# Patient Record
Sex: Male | Born: 1950 | Race: Black or African American | Hispanic: No | Marital: Married | Smoking: Former smoker
Health system: Southern US, Academic
[De-identification: ages and names within clinical notes are randomized; demographics above are authoritative.]

## PROBLEM LIST (undated history)

## (undated) DIAGNOSIS — E119 Type 2 diabetes mellitus without complications: Secondary | ICD-10-CM

## (undated) DIAGNOSIS — I509 Heart failure, unspecified: Secondary | ICD-10-CM

## (undated) DIAGNOSIS — I10 Essential (primary) hypertension: Secondary | ICD-10-CM

## (undated) HISTORY — DX: Type 2 diabetes mellitus without complications: E11.9

## (undated) HISTORY — DX: Heart failure, unspecified: I50.9

## (undated) HISTORY — PX: HX ELBOW SURGERY: 2100001297

## (undated) HISTORY — PX: HX HAND SURGERY: 2100001299

## (undated) HISTORY — PX: HX BACK SURGERY: SHX140

## (undated) HISTORY — PX: HX COLONOSCOPY: 2100001147

## (undated) HISTORY — DX: Essential (primary) hypertension: I10

---

## 1996-12-07 ENCOUNTER — Other Ambulatory Visit (HOSPITAL_COMMUNITY): Payer: Self-pay | Admitting: Internal Medicine

## 2019-04-27 IMAGING — US US RETROPERITONEAL COMPLETE
1 series · 14 of 25 positions shown · non-contrast
Comparison: None available.

EXAM:  US RETROPERITONEAL COMPLETE
INDICATION: Uncontrolled hypertension.

[Series 4: us retroperitoneal complete · 0.27mm/px · 14 of 64 slices shown]
[im 1/64]
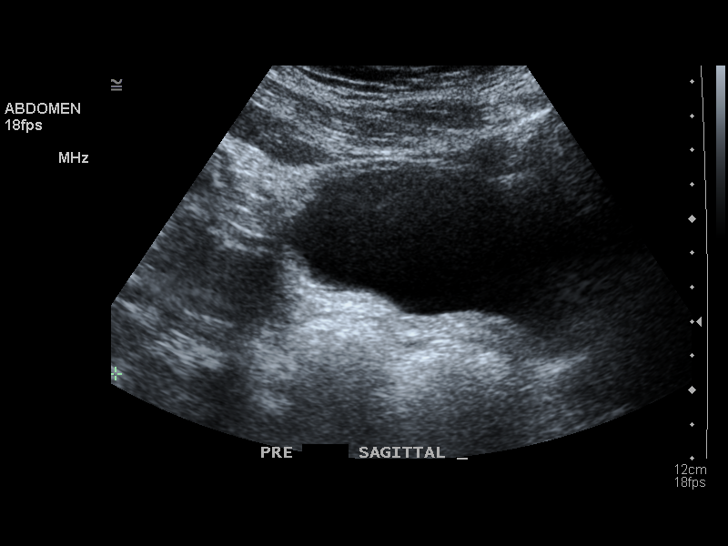
[im 6/64]
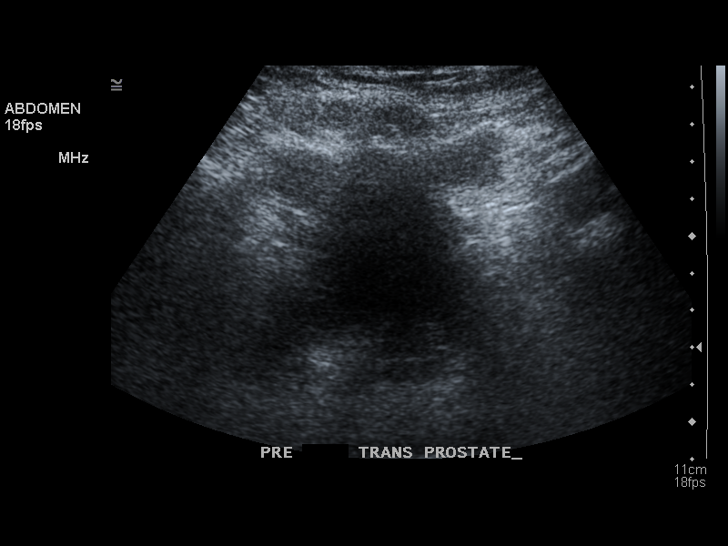
[im 11/64]
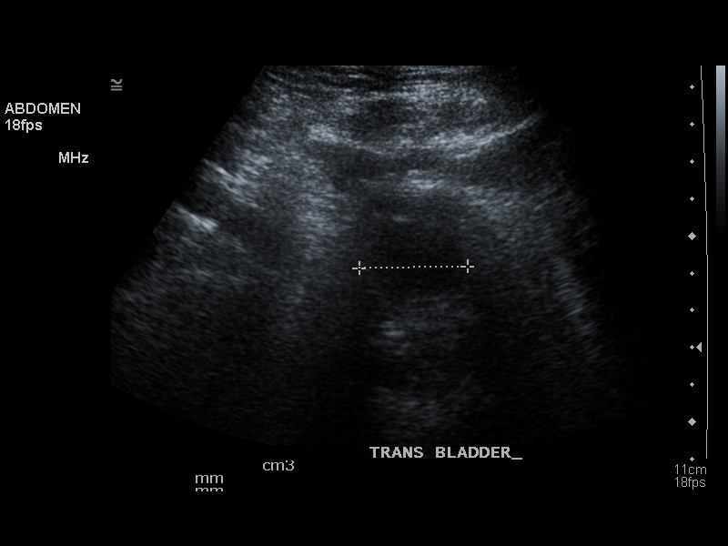
[im 16/64]
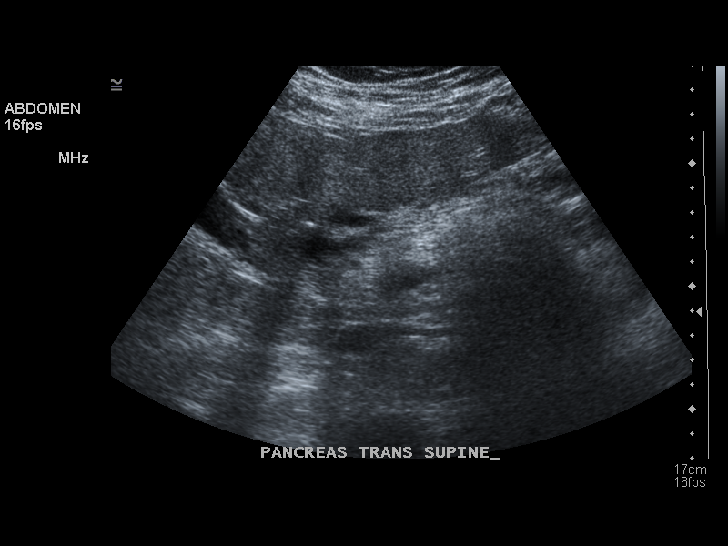
[im 22/64]
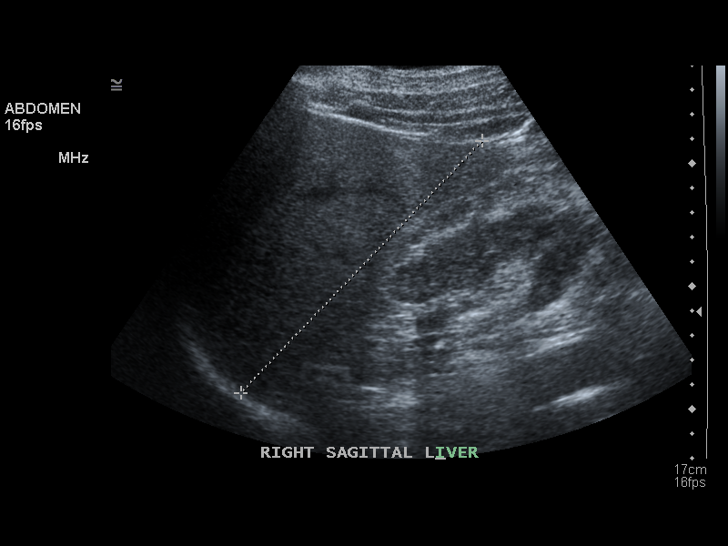
[im 24/64]
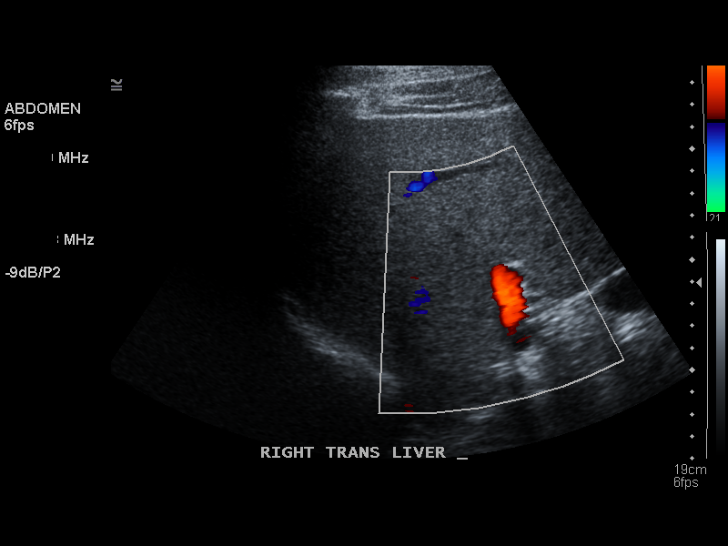
[im 29/64]
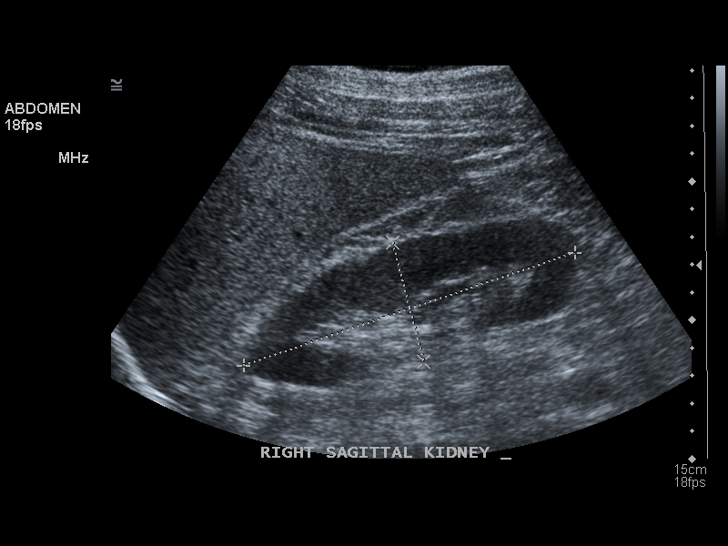
[im 35/64]
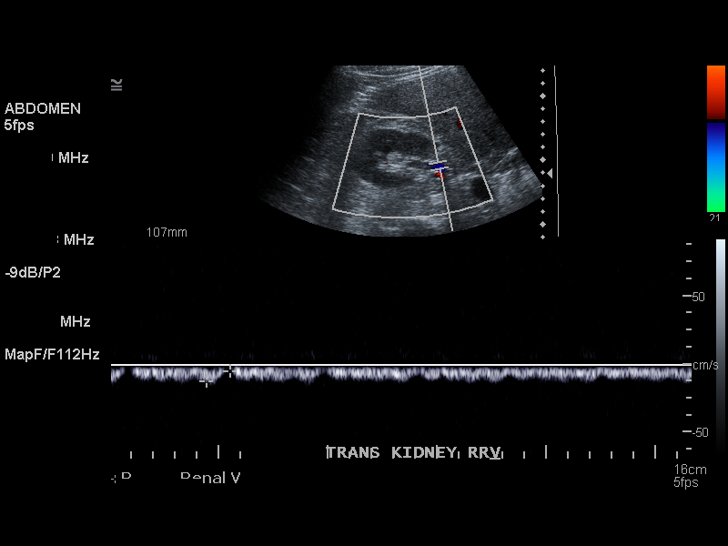
[im 40/64]
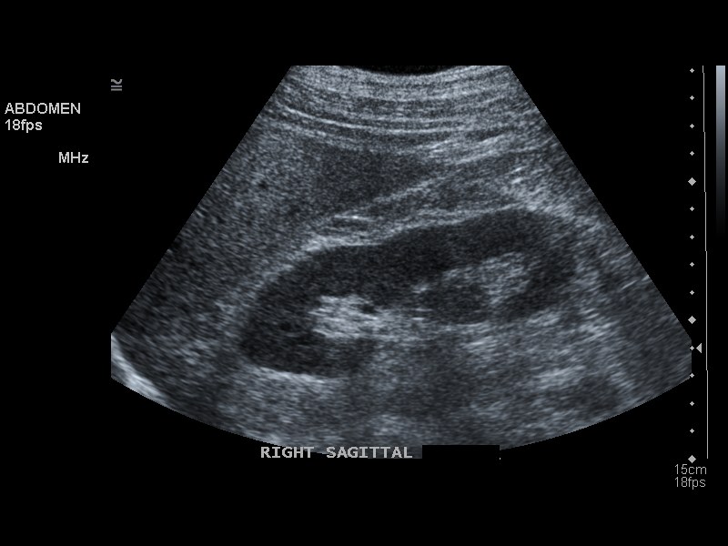
[im 43/64]
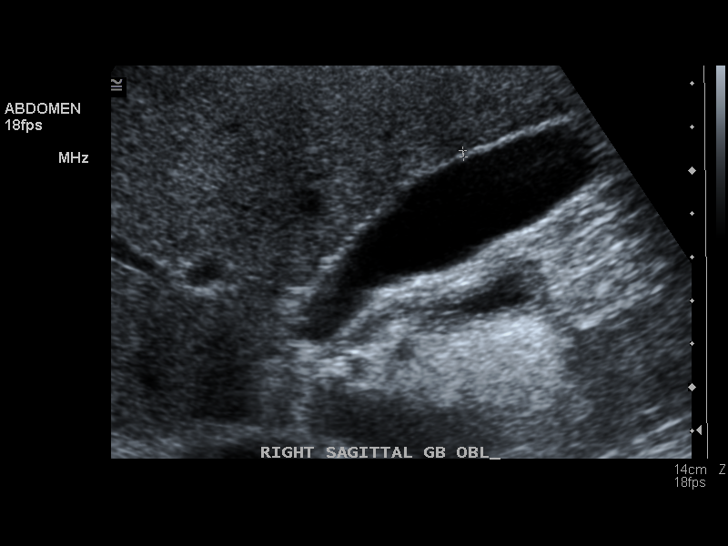
[im 48/64]
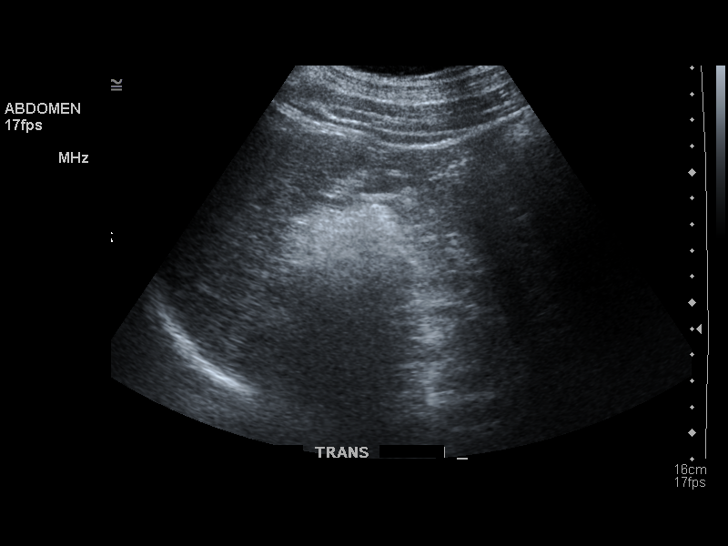
[im 53/64]
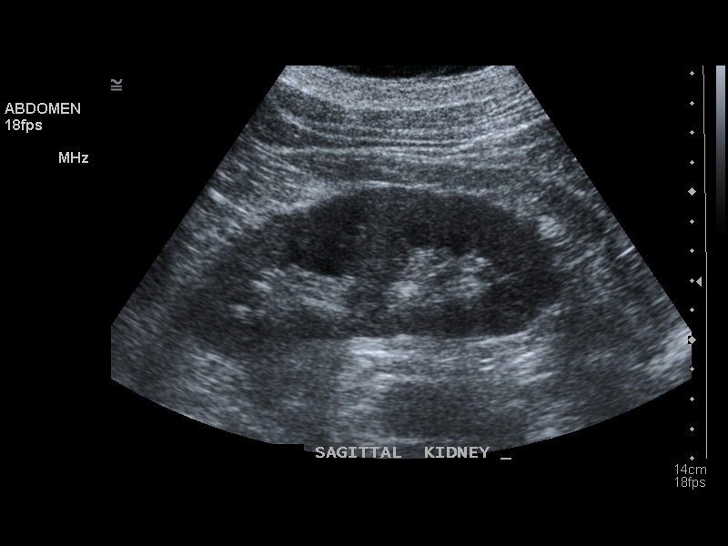
[im 58/64]
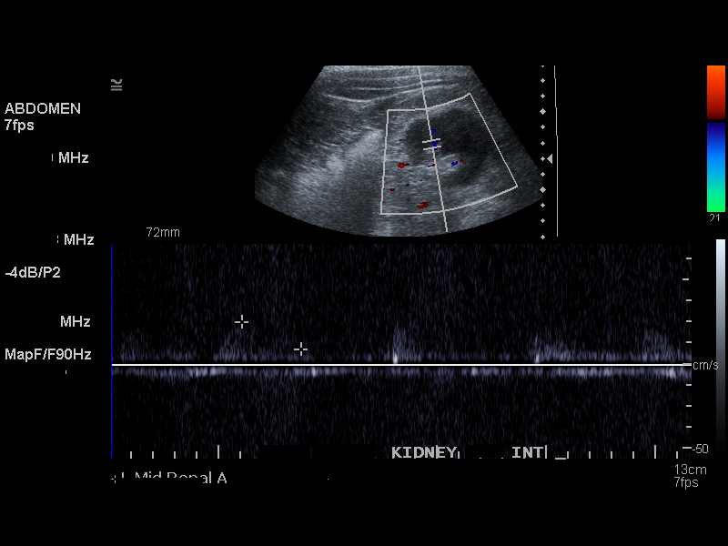
[im 64/64]
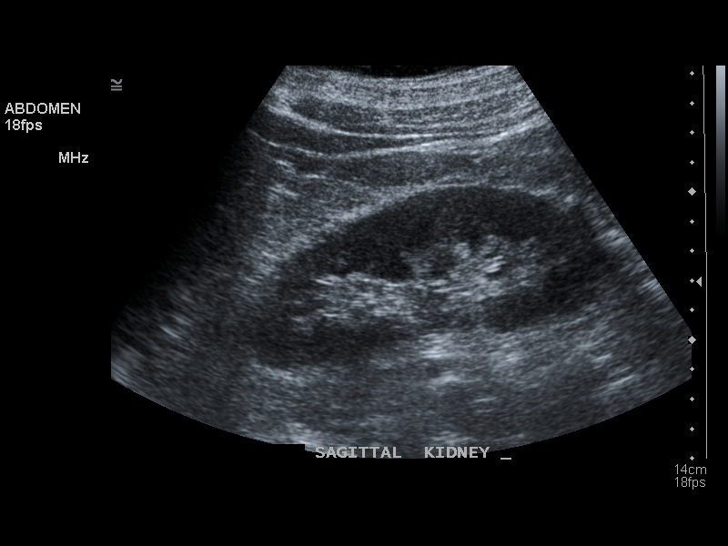

[14 of 25 positions shown; findings below may reference images not displayed]

FINDINGS: Kidneys are normal in echogenicity and measure 12.5 cm bilaterally. There is no hydronephrosis, mass, cyst or shadowing calculus on either side. There is also no evidence of renal artery stenosis.

Urinary bladder is partially distended and grossly unremarkable. There is no significant post void residual.
IMPRESSION: Unremarkable exam.

## 2019-11-11 DIAGNOSIS — E119 Type 2 diabetes mellitus without complications: Secondary | ICD-10-CM | POA: Insufficient documentation

## 2019-11-11 DIAGNOSIS — I509 Heart failure, unspecified: Secondary | ICD-10-CM | POA: Insufficient documentation

## 2021-10-03 ENCOUNTER — Encounter (INDEPENDENT_AMBULATORY_CARE_PROVIDER_SITE_OTHER): Payer: Self-pay

## 2021-12-12 ENCOUNTER — Encounter (INDEPENDENT_AMBULATORY_CARE_PROVIDER_SITE_OTHER): Payer: Self-pay | Admitting: Nephrology

## 2021-12-12 DIAGNOSIS — I10 Essential (primary) hypertension: Secondary | ICD-10-CM | POA: Insufficient documentation

## 2021-12-12 DIAGNOSIS — E559 Vitamin D deficiency, unspecified: Secondary | ICD-10-CM

## 2021-12-12 DIAGNOSIS — N189 Chronic kidney disease, unspecified: Secondary | ICD-10-CM

## 2021-12-12 DIAGNOSIS — Z131 Encounter for screening for diabetes mellitus: Secondary | ICD-10-CM

## 2021-12-12 DIAGNOSIS — N1832 Chronic kidney disease, stage 3b (CMS HCC): Secondary | ICD-10-CM | POA: Insufficient documentation

## 2021-12-12 DIAGNOSIS — E538 Deficiency of other specified B group vitamins: Secondary | ICD-10-CM

## 2021-12-12 DIAGNOSIS — D631 Anemia in chronic kidney disease: Secondary | ICD-10-CM | POA: Insufficient documentation

## 2021-12-12 NOTE — Progress Notes (Signed)
71 year old gentleman with past medical history of diabetes and hypertension on multiple blood pressure medications presenting to the clinic for follow-up.  Patient denies nausea or vomiting.  No chest pain or shortness of breath.  Patient taking currently the following medication Benicar Coreg Lasix hydralazine, he is not taking Aldactone or amlodipine.  Patient takes also clonidine 0.1 mg at bedtime.  Patient denies nausea or vomiting no chest pain or shortness of breath denies diarrhea.  He reports improvement in the edema.  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 function is better creatinine 1.7 GFR 52 mils per minute anemia of chronic kidney disease hemoglobin 11.9 hemoglobin A1c 5.9 kidney ultrasound showed small bilateral cysts benign anemia profile showed very low iron needs iron supplements twice a day drink plenty of fluids keep sugar controlled keep blood pressure control repeat lab work see him back in 6 months    Labs::       Creatinine 1.7 mg/dL (0.3-1.4)  H 06/06/21 14:40 06/06/21       Glomerular Filtration Rate Calc 52 mL/min (>59)  L 06/06/21 14:40 06/06/21       BUN/Creatinine Ratio 11  (10-20) 06/06/21 14:40 06/06/21       Potassium Level 4.6 mEq/L (3.6-5.6) 06/06/21 14:40 06/06/21       Parathyroid Hormone (Intact) 54.8 pg/mL (12-88) 06/06/21 14:10 06/06/21       Urine Microalbumin 8.0 mg/L (0-19) 06/06/21 14:40 06/06/21       Hemoglobin 11.9 g/dL (13.5-18.0)  L 06/06/21 14:40 06/06/21       Red Blood Count 4.20 M/uL (4.70-6.00)  L 06/06/21 14:40 06/06/21       Hemoglobin A1c Percent 5.9 % (4.3-6.1) 06/06/21 14:10 06/06/21       Renal Ultrasound   04/20/21 18:00 04/20/21

## 2021-12-18 ENCOUNTER — Encounter (INDEPENDENT_AMBULATORY_CARE_PROVIDER_SITE_OTHER): Payer: Self-pay | Admitting: Nephrology

## 2021-12-18 ENCOUNTER — Other Ambulatory Visit (INDEPENDENT_AMBULATORY_CARE_PROVIDER_SITE_OTHER): Payer: Self-pay | Admitting: Student in an Organized Health Care Education/Training Program

## 2021-12-18 MED ORDER — SILDENAFIL 100 MG TABLET
100.0000 mg | ORAL_TABLET | ORAL | 1 refills | Status: AC | PRN
Start: 2021-12-18 — End: 2022-03-18

## 2021-12-20 ENCOUNTER — Encounter (INDEPENDENT_AMBULATORY_CARE_PROVIDER_SITE_OTHER): Payer: Self-pay | Admitting: Nephrology

## 2021-12-25 ENCOUNTER — Ambulatory Visit (HOSPITAL_COMMUNITY): Payer: Self-pay | Admitting: HEMATOLOGY-ONCOLOGY

## 2021-12-26 ENCOUNTER — Encounter (INDEPENDENT_AMBULATORY_CARE_PROVIDER_SITE_OTHER): Payer: Self-pay | Admitting: Nephrology

## 2022-01-19 ENCOUNTER — Encounter (INDEPENDENT_AMBULATORY_CARE_PROVIDER_SITE_OTHER): Payer: Self-pay

## 2022-02-21 ENCOUNTER — Ambulatory Visit (HOSPITAL_COMMUNITY): Payer: Self-pay | Admitting: NURSE PRACTITIONER

## 2022-02-21 ENCOUNTER — Other Ambulatory Visit (INDEPENDENT_AMBULATORY_CARE_PROVIDER_SITE_OTHER): Payer: Self-pay | Admitting: Nephrology

## 2022-02-21 MED ORDER — CLONIDINE HCL 0.1 MG TABLET
0.2000 mg | ORAL_TABLET | Freq: Two times a day (BID) | ORAL | 0 refills | Status: AC
Start: 2022-02-21 — End: 2022-03-23

## 2022-02-23 ENCOUNTER — Encounter (HOSPITAL_COMMUNITY): Payer: Self-pay | Admitting: NURSE PRACTITIONER

## 2022-02-23 ENCOUNTER — Other Ambulatory Visit (HOSPITAL_COMMUNITY): Payer: MEDICARE

## 2022-02-23 ENCOUNTER — Ambulatory Visit: Payer: MEDICARE | Attending: NURSE PRACTITIONER | Admitting: NURSE PRACTITIONER

## 2022-02-23 ENCOUNTER — Other Ambulatory Visit: Payer: Self-pay

## 2022-02-23 VITALS — BP 120/66 | HR 66 | Temp 97.4°F | Ht 74.0 in | Wt 275.8 lb

## 2022-02-23 DIAGNOSIS — Z87891 Personal history of nicotine dependence: Secondary | ICD-10-CM | POA: Insufficient documentation

## 2022-02-23 DIAGNOSIS — Z862 Personal history of diseases of the blood and blood-forming organs and certain disorders involving the immune mechanism: Secondary | ICD-10-CM

## 2022-02-23 DIAGNOSIS — Z809 Family history of malignant neoplasm, unspecified: Secondary | ICD-10-CM | POA: Insufficient documentation

## 2022-02-23 DIAGNOSIS — D693 Immune thrombocytopenic purpura: Secondary | ICD-10-CM | POA: Insufficient documentation

## 2022-02-23 LAB — COMPREHENSIVE METABOLIC PANEL, NON-FASTING
ALBUMIN/GLOBULIN RATIO: 1.4 (ref 0.8–1.4)
ALBUMIN: 4.1 g/dL (ref 3.5–5.7)
ALKALINE PHOSPHATASE: 53 U/L (ref 34–104)
ALT (SGPT): 11 U/L (ref 7–52)
ANION GAP: 6 mmol/L — ABNORMAL LOW (ref 10–20)
AST (SGOT): 13 U/L (ref 13–39)
BILIRUBIN TOTAL: 0.8 mg/dL (ref 0.3–1.2)
BUN/CREA RATIO: 15 (ref 6–22)
BUN: 35 mg/dL — ABNORMAL HIGH (ref 7–25)
CALCIUM, CORRECTED: 9.3 mg/dL (ref 8.9–10.8)
CALCIUM: 9.4 mg/dL (ref 8.6–10.3)
CHLORIDE: 103 mmol/L (ref 98–107)
CO2 TOTAL: 27 mmol/L (ref 21–31)
CREATININE: 2.28 mg/dL — ABNORMAL HIGH (ref 0.60–1.30)
ESTIMATED GFR: 30 mL/min/{1.73_m2} — ABNORMAL LOW (ref >59)
GLOBULIN: 3 (ref 2.9–5.4)
GLUCOSE: 136 mg/dL — ABNORMAL HIGH (ref 74–109)
OSMOLALITY, CALCULATED: 282 mOsm/kg (ref 270–290)
POTASSIUM: 4.5 mmol/L (ref 3.5–5.1)
PROTEIN TOTAL: 7.1 g/dL (ref 6.4–8.9)
SODIUM: 136 mmol/L (ref 136–145)

## 2022-02-23 LAB — CBC
HCT: 37 % — ABNORMAL LOW (ref 42.0–51.0)
HGB: 12.3 g/dL — ABNORMAL LOW (ref 13.5–18.0)
MCH: 29.1 pg (ref 27.0–32.0)
MCHC: 33.4 g/dL (ref 32.0–36.0)
MCV: 87.2 fL (ref 78.0–99.0)
MPV: 9.9 fL (ref 7.4–10.4)
PLATELETS: 108 10*3/uL — ABNORMAL LOW (ref 140–440)
RBC: 4.24 10*6/uL (ref 4.20–6.00)
RDW: 15.6 % — ABNORMAL HIGH (ref 11.6–14.8)
WBC: 4.5 10*3/uL (ref 4.0–10.5)
WBCS UNCORRECTED: 4.5 10*3/uL

## 2022-02-23 NOTE — Patient Instructions (Signed)
Lab orders were placed for you in the Epic system today.  To complete these you may go to the first floor registration.  Once registered you will be directed to the lab for completion.  If you parked on the [facility] enterance you may use the registration there to have your labs completed. If you c wish to have you labs completed at [facility].  Please let us know so we can print out your labs for you.   Please be aware that it does take longer for me to receive results from [place].  We will call you if needed once your labs are received. I encourage you to keep all your scheduled appointment with physicians.  Mammograms are recommended yearly.  Colonoscopy is recommended starting at age 1 unless symptoms for family history warrant sooner.  Patient with colon cancer screening is based off of when initial diagnosis was and most recent colonoscopy findings.  Immunization recommendations are based on age.   I look forward to seeing you for your next follow up in 6 months.  Please call if you have any questions or concerns.      If you have not already signed up for  [place], I strongly encourage you to do so.   It will allow to you message [place] with questions, review labs and imaging, and request anappointments.   If you have not given [place] your e-mail address or phone number and you want to sign up for this service, please give check in or check out your e-mail.  You will receive an e-mail and a link to sign up and then will need to download the app on your phone.    Our office number is [phone].  You will be directed to the call center and then a message will be sent to our office.  If you are awaiting a call back from [place] please make sure to answer in toll free call numbers, as that is currently what the phone ID shows when we call from our office.        Thank you for allowing [place] to be part of your healthcare team.

## 2022-02-23 NOTE — H&P (Signed)
Department of Hematology/Oncology  History and Physical    Name: Jared Mcintyre  QIW:[id number]  Date of Birth: 04-01-1951  Encounter Date: 02/23/2022    REFERRING PROVIDER:  No referring provider defined for this encounter.    REASON FOR OFFICE VISIT:  Transfer of care from Dr. Pierre Bali office for ITP.    HISTORY OF PRESENT ILLNESS:  Jared Mcintyre is a 71 y.o. 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  Patient was initially seen in Dr. Pierre Bali office in November of 2009 for evaluation of thrombocytopenia iron-deficiency anemia.  His initial workup did not show any iron deficiency.  And platelets were stable at 128,000.  In review of the patient's chart it appears that the lowest platelet count was 119,000. A repeat ANA and RA was negative.  The patient offers no new complaints at this time.  The patient has had a good appetite and stable weight.  The patient has not noted any new areas of disease on own self exam.  The patient has not had any chest pain or dyspnea.  The patient has not had any headaches or changes in vision.  The patient has not had any abdominal pain, nausea, or vomiting.  There have been no changes in bowel or bladder habits.  The patient has not had any abnormal bleeding or clotting episodes.  There have been no complaints of fever, chills, cough, sputum production, dysuria, or diarrhea.          ROS:   Review of Systems   Constitutional: Positive for chills. Negative for fatigue and fever.   HENT:  Negative.    Eyes: Negative.    Respiratory: Positive for shortness of breath (unchanged. ).    Cardiovascular: Negative for chest pain.   Gastrointestinal: Negative for diarrhea, nausea and vomiting.   Endocrine: Negative.    Genitourinary: Negative for difficulty urinating.    Musculoskeletal: Positive for back pain (mid lower back from previous injury). Negative for arthralgias.   Neurological: Positive for dizziness (often). Negative for headaches.   Hematological: Negative for adenopathy. Does  not bruise/bleed easily.   Psychiatric/Behavioral: Negative.         History:  Past Medical History:   Diagnosis Date   . Congestive heart failure (CMS HCC)    . Diabetes mellitus, type 2 (CMS HCC)    . Essential hypertension            Past Surgical History:   Procedure Laterality Date   . HX BACK SURGERY     . HX COLONOSCOPY     . HX ELBOW SURGERY Right     Release of Tendon   . HX HAND SURGERY             Social History     Socioeconomic History   . Marital status: Married     Spouse name: Not on file   . Number of children: Not on file   . Years of education: Not on file   . Highest education level: Not on file   Occupational History   . Not on file   Tobacco Use   . Smoking status: Former     Types: Cigarettes     Quit date: 2003     Years since quitting: 20.4   . Smokeless tobacco: Never   Substance and Sexual Activity   . Alcohol use: Never   . Drug use: Never   . Sexual activity: Not on file   Other Topics Concern   .  Not on file   Social History Narrative   . Not on file     Social Determinants of Health     Financial Resource Strain: Not on file   Transportation Needs: Not on file   Social Connections: Not on file   Intimate Partner Violence: Not on file   Housing Stability: Not on file       Social History     Social History Narrative   . Not on file       Social History     Substance and Sexual Activity   Drug Use Never       Family Medical History:     Problem Relation (Age of Onset)    COPD Other    Cancer Father    Diabetes Mother    Diabetes type II Other    Elevated Lipids Other    Hypertension (High Blood Pressure) Brother, Other    Stroke Other    Transient ischemic attack Other            Current Outpatient Medications   Medication Sig   . allopurinoL (ZYLOPRIM) 100 mg Oral Tablet Every one hour   . amLODIPine (NORVASC) 5 mg Oral Tablet Take 1 Tablet (5 mg total) by mouth   . aspirin (ECOTRIN) 81 mg Oral Tablet, Delayed Release (E.C.) Take 1 Tablet (81 mg total) by mouth   . calcium  carbonate-vitamin D3 500 mg-5 mcg (200 unit) Oral Tablet Take 1 Tablet by mouth Once a day   . carvediloL (COREG) 6.25 mg Oral Tablet Take 1 Tablet (6.25 mg total) by mouth   . cloNIDine HCL (CATAPRES) 0.1 mg Oral Tablet Take 2 Tablets (0.2 mg total) by mouth Twice daily for 30 days   . colchicine 0.6 mg Oral Tablet Take 1 Tablet (0.6 mg total) by mouth   . cyanocobalamin (VITAMIN B-12) 500 mcg Oral Tablet Take 1 Tablet (500 mcg total) by mouth   . ergocalciferol, vitamin D2, (DRISDOL) 1,250 mcg (50,000 unit) Oral Capsule Vitamin D2   . ferrous sulfate (FERATAB) 324 mg (65 mg iron) Oral Tablet, Delayed Release (E.C.) Take 1 Tablet (324 mg total) by mouth   . folic acid (FOLVITE) 1 mg Oral Tablet Take 1 Tablet (1 mg total) by mouth Once a day   . furosemide (LASIX) 1 mg/mL Intravenous Injectable furosemide   . gabapentin (NEURONTIN) 100 mg Oral Capsule Take 1 Capsule (100 mg total) by mouth   . hydrALAZINE (APRESOLINE) 100 mg Oral Tablet Take 1 Tablet (100 mg total) by mouth   . isosorbide mononitrate (IMDUR) 30 mg Oral Tablet Sustained Release 24 hr Take 1 Tablet (30 mg total) by mouth Every morning   . olmesartan (BENICAR) 40 mg Oral Tablet olmesartan 40 mg tablet   . omega 3-dha-epa-fish oil 183.3 mg-75 mg -91.6 mg-306 mg Oral Capsule omega 3 183.3 mg-dha 75 mg-epa 91.6 mg-fish oil 306 mg capsule   Take by oral route.   Marland Kitchen omega-3 fatty acid (LOVAZA) 1 gram Oral Capsule Take 2 Capsules (2 g total) by mouth   . rosuvastatin (CRESTOR) 10 mg Oral Tablet rosuvastatin 10 mg tablet   TAKE 1 TABLET BY MOUTH EVERY DAY AT BEDTIME   . Sildenafil (VIAGRA) 100 mg Oral Tablet Take 1 Tablet (100 mg total) by mouth Every 24 hours as needed for up to 90 days   . SITagliptin phosphate (JANUVIA) 100 mg Oral Tablet Once a day   . traZODone (DESYREL) 100 mg Oral Tablet trazodone 100  mg tablet   Take 1 tablet by oral route at bedtime.   . Vitamin B12-Folic Acid 3.7-3 mg Oral Tablet Vitamin B-12       Allergies   Allergen Reactions   .  Indomethacin  Other Adverse Reaction (Add comment) and Swelling         PHYSICAL EXAM:  Most Recent Vitals    Flowsheet Row Telemedicine from 02/23/2022 in Hematology/Oncology, [facility]   Temperature 36.3 C (97.4 F) filed at... 02/23/2022 1008   Heart Rate 66 filed at... 02/23/2022 1008   Respiratory Rate --   BP (Non-Invasive) 120/66 filed at... 02/23/2022 1008   SpO2 99 % filed at... 02/23/2022 1008   Height 1.88 m (6\' 2" ) filed at... 02/23/2022 1008   Weight 125 kg (275 lb 12.8 oz) filed at... 02/23/2022 1008   BMI (Calculated) 35.48 filed at... 02/23/2022 1008   BSA (Calculated) 2.56 filed at... 02/23/2022 1008      ECOG Status: (0) Fully active, able to carry on all predisease performance without restriction    Physical Exam  Vitals reviewed.   Constitutional:       Appearance: Normal appearance. He is normal weight.   Eyes:      Conjunctiva/sclera: Conjunctivae normal.      Pupils: Pupils are equal, round, and reactive to light.   Neck:      Thyroid: No thyroid mass, thyromegaly or thyroid tenderness.   Cardiovascular:      Rate and Rhythm: Normal rate and regular rhythm.      Pulses: Normal pulses.      Heart sounds: Normal heart sounds, S1 normal and S2 normal. No murmur heard.    No S3 or S4 sounds.   Pulmonary:      Effort: Pulmonary effort is normal.      Breath sounds: Normal breath sounds.   Abdominal:      General: Bowel sounds are normal.      Palpations: Abdomen is soft.   Musculoskeletal:         General: Normal range of motion.      Cervical back: Normal range of motion and neck supple.      Right lower leg: No edema.      Left lower leg: No edema.   Lymphadenopathy:      Head:      Right side of head: No submental, submandibular, preauricular, posterior auricular or occipital adenopathy.      Left side of head: No submental, submandibular, preauricular, posterior auricular or occipital adenopathy.      Cervical: No cervical adenopathy.      Right cervical: No superficial,  deep or posterior cervical adenopathy.     Left cervical: No superficial, deep or posterior cervical adenopathy.      Upper Body:      Right upper body: No supraclavicular or axillary adenopathy.      Left upper body: No supraclavicular or axillary adenopathy.      Lower Body: No right inguinal adenopathy. No left inguinal adenopathy.   Skin:     General: Skin is warm and dry.      Capillary Refill: Capillary refill takes less than 2 seconds.   Neurological:      General: No focal deficit present.      Mental Status: He is alert and oriented to person, place, and time. Mental status is at baseline.      Motor: Motor function is intact.      Coordination: Coordination  is intact.      Gait: Gait is intact.   Psychiatric:         Attention and Perception: Attention and perception normal.         Mood and Affect: Mood and affect normal.         Speech: Speech normal.         Behavior: Behavior normal. Behavior is cooperative.         Thought Content: Thought content normal.         Cognition and Memory: Cognition and memory normal.         Judgment: Judgment normal.          LABS:   01/23/21  WBC 5.8, Hgb 11.6, Hct 35.2, Plt 135,000    ASSESSMENT:    ICD-10-CM    1. History of ITP  Z86.2 CBC     COMPREHENSIVE METABOLIC PANEL, NON-FASTING      2. 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 All relative external and internal medical records were reviewed including available H&Ps, progress notes, procedure notes, imaging's, laboratories, and pathology.   2. Thrombocytopenia:  Patient is stable and asymptomatic at this time.  He will have CBC and CMP completed today.  I will see him in 6 months, sooner if needed.   3.   Patient reports that he is on Januvia 100 mg PO daily and Tradjenta 5 mg PO daily.  One is from his endocrinologist and the other is from his nephrologist.   I have asked him to discuss this with his endocrinologist as they are both  DPP-4 medications and typically you only take one of these medications.     Jared Mcintyre was given the chance to ask questions, and these were answered to their satisfaction. The patient is welcome to call with any questions or concerns in the meantime.     On the day of the encounter, a total of  57 minutes was spent on this patient encounter including review of historical information, examination, documentation and post-visit activities.     Return in about 6 months (around 08/25/2022).     Patrcia Dolly, FNP-BC  02/23/2022, 10:37  You can see your note(s) in [facility]. It is common for you to encounter certain medical terminology which may be unfamiliar to you. You might see results before your provider does so please give at least 2 business days for review. Please have this understanding, that NOT all abnormal results are significant. Our office will contact you for any urgent or emergent action if necessary. If you have any questions or concerns, feel free to send a MyChart message or call the office. Please call with any new or concerning symptoms.     CC:  Gearlean Alf, MD  [place] [id number]    No referring provider defined for this encounter.      This note was partially generated using MModal Fluency Direct system, and there may be some incorrect words, spellings, and punctuation that were not noted in checking the note before saving.

## 2022-02-26 ENCOUNTER — Encounter (HOSPITAL_COMMUNITY): Payer: Self-pay | Admitting: NURSE PRACTITIONER

## 2022-03-05 ENCOUNTER — Encounter (HOSPITAL_COMMUNITY): Payer: Self-pay | Admitting: NURSE PRACTITIONER

## 2022-03-08 ENCOUNTER — Other Ambulatory Visit: Payer: Self-pay

## 2022-04-10 ENCOUNTER — Ambulatory Visit (INDEPENDENT_AMBULATORY_CARE_PROVIDER_SITE_OTHER): Payer: MEDICARE

## 2022-04-10 ENCOUNTER — Other Ambulatory Visit: Payer: Self-pay

## 2022-04-10 ENCOUNTER — Other Ambulatory Visit: Payer: MEDICARE | Attending: Nephrology | Admitting: Nephrology

## 2022-04-10 DIAGNOSIS — E559 Vitamin D deficiency, unspecified: Secondary | ICD-10-CM

## 2022-04-10 DIAGNOSIS — N1832 Chronic kidney disease, stage 3b (CMS HCC): Secondary | ICD-10-CM

## 2022-04-10 DIAGNOSIS — E538 Deficiency of other specified B group vitamins: Secondary | ICD-10-CM

## 2022-04-10 DIAGNOSIS — D631 Anemia in chronic kidney disease: Secondary | ICD-10-CM | POA: Insufficient documentation

## 2022-04-10 DIAGNOSIS — Z131 Encounter for screening for diabetes mellitus: Secondary | ICD-10-CM

## 2022-04-10 LAB — COMPREHENSIVE METABOLIC PANEL, NON-FASTING
ALBUMIN/GLOBULIN RATIO: 1.5 — ABNORMAL HIGH (ref 0.8–1.4)
ALBUMIN: 4.1 g/dL (ref 3.5–5.7)
ALKALINE PHOSPHATASE: 54 U/L (ref 34–104)
ALT (SGPT): 13 U/L (ref 7–52)
ANION GAP: 5 mmol/L — ABNORMAL LOW (ref 10–20)
AST (SGOT): 16 U/L (ref 13–39)
BILIRUBIN TOTAL: 0.5 mg/dL (ref 0.3–1.2)
BUN/CREA RATIO: 13 (ref 6–22)
BUN: 30 mg/dL — ABNORMAL HIGH (ref 7–25)
CALCIUM, CORRECTED: 9.1 mg/dL (ref 8.9–10.8)
CALCIUM: 9.2 mg/dL (ref 8.6–10.3)
CHLORIDE: 101 mmol/L (ref 98–107)
CO2 TOTAL: 29 mmol/L (ref 21–31)
CREATININE: 2.23 mg/dL — ABNORMAL HIGH (ref 0.60–1.30)
ESTIMATED GFR: 31 mL/min/{1.73_m2} — ABNORMAL LOW (ref >59)
GLOBULIN: 2.8 — ABNORMAL LOW (ref 2.9–5.4)
GLUCOSE: 142 mg/dL — ABNORMAL HIGH (ref 74–109)
OSMOLALITY, CALCULATED: 279 mOsm/kg (ref 270–290)
POTASSIUM: 4.8 mmol/L (ref 3.5–5.1)
PROTEIN TOTAL: 6.9 g/dL (ref 6.4–8.9)
SODIUM: 135 mmol/L — ABNORMAL LOW (ref 136–145)

## 2022-04-10 LAB — CBC WITH DIFF
BASOPHIL #: 0 10*3/uL (ref 0.00–0.30)
BASOPHIL %: 1 % (ref 0–3)
EOSINOPHIL #: 0.1 10*3/uL (ref 0.00–0.80)
EOSINOPHIL %: 3 % (ref 0–7)
HCT: 35.6 % — ABNORMAL LOW (ref 42.0–51.0)
HGB: 11.7 g/dL — ABNORMAL LOW (ref 13.5–18.0)
LYMPHOCYTE #: 1 10*3/uL — ABNORMAL LOW (ref 1.10–5.00)
LYMPHOCYTE %: 23 % — ABNORMAL LOW (ref 25–45)
MCH: 28.9 pg (ref 27.0–32.0)
MCHC: 32.8 g/dL (ref 32.0–36.0)
MCV: 88.2 fL (ref 78.0–99.0)
MONOCYTE #: 0.4 10*3/uL (ref 0.00–1.30)
MONOCYTE %: 8 % (ref 0–12)
MPV: 10.4 fL (ref 7.4–10.4)
NEUTROPHIL #: 2.9 10*3/uL (ref 1.80–8.40)
NEUTROPHIL %: 66 % (ref 40–76)
PLATELETS: 104 10*3/uL — ABNORMAL LOW (ref 140–440)
RBC: 4.04 10*6/uL — ABNORMAL LOW (ref 4.20–6.00)
RDW: 15.4 % — ABNORMAL HIGH (ref 11.6–14.8)
WBC: 4.3 10*3/uL (ref 4.0–10.5)
WBCS UNCORRECTED: 4.3 10*3/uL

## 2022-04-10 LAB — RENAL FUNCTION PANEL
ALBUMIN: 4.1 g/dL (ref 3.5–5.7)
ANION GAP: 5 mmol/L — ABNORMAL LOW (ref 10–20)
BUN: 30 mg/dL — ABNORMAL HIGH (ref 7–25)
CALCIUM, CORRECTED: 9.1 mg/dL (ref 8.9–10.8)
CALCIUM: 9.2 mg/dL (ref 8.6–10.3)
CHLORIDE: 101 mmol/L (ref 98–107)
CO2 TOTAL: 29 mmol/L (ref 21–31)
CREATININE: 2.23 mg/dL — ABNORMAL HIGH (ref 0.60–1.30)
ESTIMATED GFR: 31 mL/min/{1.73_m2} — ABNORMAL LOW (ref >59)
GLUCOSE: 142 mg/dL — ABNORMAL HIGH (ref 74–109)
OSMOLALITY, CALCULATED: 279 mOsm/kg (ref 270–290)
PHOSPHORUS: 3.1 mg/dL — ABNORMAL LOW (ref 3.7–7.2)
POTASSIUM: 4.8 mmol/L (ref 3.5–5.1)
SODIUM: 135 mmol/L — ABNORMAL LOW (ref 136–145)

## 2022-04-10 LAB — FOLATE: FOLATE: >24.2 ng/mL (ref 5.9–24.4)

## 2022-04-10 LAB — IRON TRANSFERRIN AND TIBC
IRON (TRANSFERRIN) SATURATION: 16 % — ABNORMAL LOW (ref 20–50)
IRON: 53 ug/dL (ref 50–212)
TOTAL IRON BINDING CAPACITY: 337 ug/dL (ref 250–450)
TRANSFERRIN: 241 mg/dL (ref 203–362)
UIBC: 284 ug/dL (ref 130–375)

## 2022-04-10 LAB — VITAMIN B12: VITAMIN B 12: 739 pg/mL (ref 180–914)

## 2022-04-10 LAB — URIC ACID: URIC ACID: 5.2 mg/dL (ref 2.3–7.6)

## 2022-04-10 LAB — HGA1C (HEMOGLOBIN A1C WITH EST AVG GLUCOSE): HEMOGLOBIN A1C: 6.8 % — ABNORMAL HIGH (ref 4.0–6.0)

## 2022-04-10 LAB — PARATHYROID HORMONE (PTH): PTH: 71.2 pg/mL (ref 12.0–88.0)

## 2022-04-10 LAB — VITAMIN D 25 TOTAL: VITAMIN D: 52 ng/mL (ref 30–100)

## 2022-04-10 LAB — FERRITIN: FERRITIN: 95 ng/mL (ref 11–336)

## 2022-04-10 LAB — MICROALBUMIN URINE, RANDOM: MICROALBUMIN RANDOM URINE: 0.5 mg/dL

## 2022-04-17 ENCOUNTER — Other Ambulatory Visit: Payer: Self-pay

## 2022-04-17 ENCOUNTER — Encounter (INDEPENDENT_AMBULATORY_CARE_PROVIDER_SITE_OTHER): Payer: Self-pay | Admitting: Nephrology

## 2022-04-17 ENCOUNTER — Ambulatory Visit (INDEPENDENT_AMBULATORY_CARE_PROVIDER_SITE_OTHER): Payer: MEDICARE | Admitting: Nephrology

## 2022-04-17 VITALS — BP 109/54 | HR 56 | Ht 74.0 in | Wt 278.0 lb

## 2022-04-17 DIAGNOSIS — N1832 Chronic kidney disease, stage 3b (CMS HCC): Secondary | ICD-10-CM

## 2022-04-17 DIAGNOSIS — I130 Hypertensive heart and chronic kidney disease with heart failure and stage 1 through stage 4 chronic kidney disease, or unspecified chronic kidney disease: Secondary | ICD-10-CM

## 2022-04-17 DIAGNOSIS — E1122 Type 2 diabetes mellitus with diabetic chronic kidney disease: Secondary | ICD-10-CM

## 2022-04-17 DIAGNOSIS — I10 Essential (primary) hypertension: Secondary | ICD-10-CM

## 2022-04-17 DIAGNOSIS — I509 Heart failure, unspecified: Secondary | ICD-10-CM

## 2022-04-17 DIAGNOSIS — D631 Anemia in chronic kidney disease: Secondary | ICD-10-CM

## 2022-04-17 DIAGNOSIS — E559 Vitamin D deficiency, unspecified: Secondary | ICD-10-CM

## 2022-04-17 NOTE — Progress Notes (Signed)
NEPHROLOGY, [place]  [address]  [place] [address]       Name: Jared Mcintyre MRN:  [id number]   Date of Birth: 10-26-1950 Age: 71 y.o.   Date: 04/17/2022  Time: 10:16       Nephrology Office Note    Reason for visit: Follow Up (F/U labs )      History of Present Illness:  Jared Mcintyre is a 71 y.o. white  gentleman with past medical history of diabetes and hypertension on multiple blood pressure medications presenting to the clinic for follow-up.  Patient denies nausea or vomiting.  No chest pain or shortness of breath.  Patient taking currently the following medication Benicar Coreg Lasix hydralazine, he is not taking Aldactone or amlodipine.  Patient takes also clonidine 0.1 mg at bedtime.  Patient denies nausea or vomiting no chest pain or shortness of breath denies diarrhea.  He reports improvement in the edema.  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 function is better creatinine 1.7 GFR 52 mils per minute anemia of chronic kidney disease hemoglobin 11.9 hemoglobin A1c 5.9 kidney ultrasound showed small bilateral cysts benign anemia profile showed very low iron needs iron supplements twice a day drink plenty of fluids keep sugar controlled keep blood pressure control repeat lab work see him back in 6 months  04-17-2022  Nephrology clinic visit for this patient chronic kidney disease stage 3 kidney function is worse creatinine 2.2 GFR 31 mL/minute blood pressure on the low side will adjust medications drink plenty of fluids avoid NSAIDs keep blood pressure controlled keep sugar controlled repeat lab work see him back in 6 months anemia of chronic kidney disease hemoglobin 11.7 iron saturation is low 16 percent continue iron supplements twice a day vitamin F59 folic acid within normal vitamin-D within normal drink plenty of fluids repeat lab work see him back in 6 months  Patient has no edema in his legs and  his lungs are clear will ask him to cut down Lasix 20 milligram once a day instead of 40 milligram once a day and discuss this with his cardiologist because of worsening kidney function   .    Past Medical History:  Past Medical History:   Diagnosis Date    Congestive heart failure (CMS HCC)     Diabetes mellitus, type 2 (CMS HCC)     Essential hypertension          Past Surgical History:  Past Surgical History:   Procedure Laterality Date    HX BACK SURGERY      HX COLONOSCOPY      HX ELBOW SURGERY Right     Release of Tendon    HX HAND SURGERY           Allergies:  Allergies   Allergen Reactions    Indomethacin  Other Adverse Reaction (Add comment) and Swelling     Medications:  Current Outpatient Medications   Medication Sig    allopurinoL (ZYLOPRIM) 100 mg Oral Tablet Every one hour    amLODIPine (NORVASC) 5 mg Oral Tablet Take 1 Tablet (5 mg total) by mouth    aspirin (ECOTRIN) 81 mg Oral Tablet, Delayed Release (E.C.) Take 1 Tablet (81 mg total) by mouth    calcium carbonate-vitamin D3 500 mg-5 mcg (200 unit) Oral Tablet Take 1 Tablet by mouth Once a day    carvediloL (COREG) 6.25 mg Oral Tablet Take  1 Tablet (6.25 mg total) by mouth    colchicine 0.6 mg Oral Tablet Take 1 Tablet (0.6 mg total) by mouth    cyanocobalamin (VITAMIN B-12) 500 mcg Oral Tablet Take 1 Tablet (500 mcg total) by mouth    ergocalciferol, vitamin D2, (DRISDOL) 1,250 mcg (50,000 unit) Oral Capsule Vitamin D2    ferrous sulfate (FERATAB) 324 mg (65 mg iron) Oral Tablet, Delayed Release (E.C.) Take 1 Tablet (324 mg total) by mouth    folic acid (FOLVITE) 1 mg Oral Tablet Take 1 Tablet (1 mg total) by mouth Once a day    furosemide (LASIX) 1 mg/mL Intravenous Injectable furosemide    gabapentin (NEURONTIN) 100 mg Oral Capsule Take 1 Capsule (100 mg total) by mouth    hydrALAZINE (APRESOLINE) 100 mg Oral Tablet Take 1 Tablet (100 mg total) by mouth    isosorbide mononitrate (IMDUR) 30 mg Oral Tablet Sustained Release 24 hr Take 1 Tablet (30  mg total) by mouth Every morning    olmesartan (BENICAR) 40 mg Oral Tablet olmesartan 40 mg tablet    omega 3-dha-epa-fish oil 183.3 mg-75 mg -91.6 mg-306 mg Oral Capsule omega 3 183.3 mg-dha 75 mg-epa 91.6 mg-fish oil 306 mg capsule   Take by oral route.    omega-3 fatty acid (LOVAZA) 1 gram Oral Capsule Take 2 Capsules (2 g total) by mouth    rosuvastatin (CRESTOR) 10 mg Oral Tablet rosuvastatin 10 mg tablet   TAKE 1 TABLET BY MOUTH EVERY DAY AT BEDTIME    SITagliptin phosphate (JANUVIA) 100 mg Oral Tablet Once a day    traZODone (DESYREL) 100 mg Oral Tablet trazodone 100 mg tablet   Take 1 tablet by oral route at bedtime.    Vitamin B12-Folic Acid 9.5-0 mg Oral Tablet Vitamin B-12     Family History:  Family Medical History:       Problem Relation (Age of Onset)    COPD Other    Cancer Father    Diabetes Mother    Diabetes type II Other    Elevated Lipids Other    Hypertension (High Blood Pressure) Brother, Other    Stroke Other    Transient ischemic attack Other            Social History:  Social History     Socioeconomic History    Marital status: Married   Tobacco Use    Smoking status: Former     Types: Cigarettes     Quit date: 2003     Years since quitting: 20.5    Smokeless tobacco: Never   Substance and Sexual Activity    Alcohol use: Never    Drug use: Never       Review of Systems:  Constitutional: negative for fevers, chills, sweats, and fatigue  Eyes: negative for visual disturbance, irritation, redness, and icterus  Ears, nose, mouth, throat, and face: negative for hearing loss, ear drainage, nasal congestion, epistaxis  Respiratory: negative for cough, sputum, hemoptysis, wheezing, or dyspnea on exertion  Cardiovascular: negative for chest pain, palpitations, syncope, orthopnea, paroxysmal nocturnal dyspnea, and   Gastrointestinal: negative for dysphagia, nausea, vomiting, melena, diarrhea, constipation, and abdominal pain  Genitourinary:negative for frequency, dysuria, nocturia, urinary  incontinence, hesitancy, and hematuria  Integument/breast: negative for rash, skin lesion(s), and pruritus  Hematologic/lymphatic: negative for easy bruising, bleeding, and petechiae  Musculoskeletal:negative for myalgias, arthralgias, neck pain, back pain, no lower extremity edema, and muscle weakness  Neurological: negative for headaches, dizziness, seizures, speech problems, tremor, and weakness  Behavioral/Psych: negative for anxiety, behavior problems, mood swings, and sleep disturbance  Endocrine: negative for temperature intolerance  Allergic/Immunologic: negative for urticaria and angioedema    Physical Exam:  Vitals:    04/17/22 1009   BP: (!) 109/54   Pulse: 56   Weight: 126 kg (278 lb)   Height: 1.88 m (6\' 2" )   BMI: 35.77      Constitutional: Alert and Oriented, no distress,   HEENT : normocephalic , atraumatic vision and hearing grossly normal   Eyes: Conjunctiva clear., Pupils equal and round, reactive to light and accomodation. , Sclera non-icteric.   ENT: Nose without erythema. , Mouth mucous membranes moist.   Neck: no thyromegaly or lymphadenopathy and supple, symmetrical, trachea midline  Respiratory: Clear to auscultation bilaterally. No wheezes, No rales, Good air exchange bilaterally  Cardiovascular: regular rate and rhythm no murmurs no rub  Gastrointestinal: Soft, non-tender, Bowel sounds normal, No hepatosplenomegaly  Genitourinary: no suprapubic tenderness  Lower Extremities: No edema, Pulses +4  Neurologic: Grossly normal. Speech clear.   Psychiatric: Normal mood and affect     Lab:  Lab Results   Component Value Date    BUN 30 (H) 04/10/2022    BUN 30 (H) 04/10/2022    BUN 35 (H) 02/23/2022    CREATININE 2.23 (H) 04/10/2022    CREATININE 2.23 (H) 04/10/2022    CREATININE 2.28 (H) 02/23/2022    BUNCRRATIO 13 04/10/2022    BUNCRRATIO 15 02/23/2022    GFR 31 (L) 04/10/2022    GFR 31 (L) 04/10/2022    GFR 30 (L) 02/23/2022    SODIUM 135 (L) 04/10/2022    SODIUM 135 (L) 04/10/2022    SODIUM  136 02/23/2022    POTASSIUM 4.8 04/10/2022    POTASSIUM 4.8 04/10/2022    POTASSIUM 4.5 02/23/2022    CHLORIDE 101 04/10/2022    CHLORIDE 101 04/10/2022    CHLORIDE 103 02/23/2022    CO2 29 04/10/2022    CO2 29 04/10/2022    CO2 27 02/23/2022    ANIONGAP 5 (L) 04/10/2022    ANIONGAP 5 (L) 04/10/2022    ANIONGAP 6 (L) 02/23/2022    CALCIUM 9.2 04/10/2022    CALCIUM 9.2 04/10/2022    CALCIUM 9.4 02/23/2022    PHOSPHORUS 3.1 (L) 04/10/2022    ALBUMIN 4.1 04/10/2022    ALBUMIN 4.1 04/10/2022    ALBUMIN 4.1 02/23/2022    HGB 11.7 (L) 04/10/2022    HGB 12.3 (L) 02/23/2022    HCT 35.6 (L) 04/10/2022    HCT 37.0 (L) 02/23/2022    INTACTPTH 71.2 04/10/2022    IRON 53 04/10/2022    IRONBINDCAP 337 04/10/2022    IRONSAT 16 (L) 04/10/2022    FERRITIN 95 04/10/2022       Lab Results   Component Value Date    HA1C 6.8 (H) 04/10/2022    URICACID 5.2 04/10/2022        Assessment and Plan:  ENCOUNTER DIAGNOSES     ICD-10-CM   1. Stage 3b chronic kidney disease (CMS [facility])  N18.32   2. Anemia in stage 3b chronic kidney disease (CMS HCC)  N18.32    D63.1   3. Essential (primary) hypertension  I10   4. Type 2 diabetes mellitus with stage 3b chronic kidney disease, without long-term current use of insulin (CMS HCC)  E11.22    N18.32   5. Congestive heart failure, unspecified HF chronicity, unspecified heart failure type (CMS HCC)  I50.9  Plan:  CKD stage IIIB kidney function is worse drink plenty of fluids avoid NSAID keep blood pressure control repeat lab work see him back in 6 months discuss with your cardiologist to cut down to Lasix 20 milligram once a day instead of 40 milligram once a day because of worsening kidney function  Anemia of chronic kidney disease continue iron supplements twice a day  Type 2 diabetes keep off metformin and keep sugar control hemoglobin A1c 6.8 which is acceptable will give him samples of Farxiga 10 milligram once a day   Essential hypertension blood pressure well controlled  CHF on Lasix              No follow-ups on file.    Richardean Sale, MD     Portions of this note may be dictated using voice recognition software or a dictation service. Variances in spelling and vocabulary are possible and unintentional. Not all errors are caught/corrected. Please notify the [facility] if any discrepancies are noted or if the meaning of any statement is not clear. ,b

## 2022-06-25 ENCOUNTER — Other Ambulatory Visit (INDEPENDENT_AMBULATORY_CARE_PROVIDER_SITE_OTHER): Payer: Self-pay | Admitting: Nephrology

## 2022-06-25 MED ORDER — FOLIC ACID 1 MG TABLET
1.0000 mg | ORAL_TABLET | Freq: Every day | ORAL | 12 refills | Status: AC
Start: 2022-06-25 — End: 2022-07-25

## 2022-08-15 ENCOUNTER — Other Ambulatory Visit: Payer: Self-pay

## 2022-08-15 ENCOUNTER — Other Ambulatory Visit: Payer: MEDICARE | Attending: NURSE PRACTITIONER

## 2022-08-15 ENCOUNTER — Other Ambulatory Visit (HOSPITAL_COMMUNITY): Payer: Self-pay | Admitting: HEMATOLOGY-ONCOLOGY

## 2022-08-15 DIAGNOSIS — Z862 Personal history of diseases of the blood and blood-forming organs and certain disorders involving the immune mechanism: Secondary | ICD-10-CM | POA: Insufficient documentation

## 2022-08-15 LAB — CBC WITH DIFF
BASOPHIL #: 0 10*3/uL (ref 0.00–0.10)
BASOPHIL %: 1 % (ref 0–1)
EOSINOPHIL #: 0.2 10*3/uL (ref 0.00–0.50)
EOSINOPHIL %: 3 %
HCT: 39.3 % (ref 36.7–47.1)
HGB: 12.9 g/dL (ref 12.5–16.3)
LYMPHOCYTE #: 1.3 10*3/uL (ref 1.00–3.00)
LYMPHOCYTE %: 24 % (ref 16–44)
MCH: 28.8 pg (ref 23.8–33.4)
MCHC: 32.8 g/dL (ref 32.5–36.3)
MCV: 87.9 fL (ref 73.0–96.2)
MONOCYTE #: 0.5 10*3/uL (ref 0.30–1.00)
MONOCYTE %: 9 % (ref 5–13)
MPV: 9.1 fL (ref 7.4–11.4)
NEUTROPHIL #: 3.4 10*3/uL (ref 1.85–7.80)
NEUTROPHIL %: 63 % (ref 43–77)
PLATELETS: 125 10*3/uL — ABNORMAL LOW (ref 140–440)
RBC: 4.47 10*6/uL (ref 4.06–5.63)
RDW: 15.3 % (ref 12.1–16.2)
WBC: 5.4 10*3/uL (ref 3.6–10.2)

## 2022-08-15 LAB — COMPREHENSIVE METABOLIC PANEL, NON-FASTING
ALBUMIN/GLOBULIN RATIO: 1.3 (ref 0.8–1.4)
ALBUMIN: 4.1 g/dL (ref 3.5–5.7)
ALKALINE PHOSPHATASE: 54 U/L (ref 34–104)
ALT (SGPT): 18 U/L (ref 7–52)
ANION GAP: 7 mmol/L (ref 4–13)
AST (SGOT): 19 U/L (ref 13–39)
BILIRUBIN TOTAL: 0.5 mg/dL (ref 0.3–1.2)
BUN/CREA RATIO: 16 (ref 6–22)
BUN: 39 mg/dL — ABNORMAL HIGH (ref 7–25)
CALCIUM, CORRECTED: 8.9 mg/dL (ref 8.9–10.8)
CALCIUM: 9 mg/dL (ref 8.6–10.3)
CHLORIDE: 105 mmol/L (ref 98–107)
CO2 TOTAL: 25 mmol/L (ref 21–31)
CREATININE: 2.44 mg/dL — ABNORMAL HIGH (ref 0.60–1.30)
ESTIMATED GFR: 28 mL/min/{1.73_m2} — ABNORMAL LOW (ref >59)
GLOBULIN: 3.1 (ref 2.9–5.4)
GLUCOSE: 141 mg/dL — ABNORMAL HIGH (ref 74–109)
OSMOLALITY, CALCULATED: 286 mOsm/kg (ref 270–290)
POTASSIUM: 4.5 mmol/L (ref 3.5–5.1)
PROTEIN TOTAL: 7.2 g/dL (ref 6.4–8.9)
SODIUM: 137 mmol/L (ref 136–145)

## 2022-08-23 ENCOUNTER — Ambulatory Visit (HOSPITAL_COMMUNITY): Payer: Self-pay | Admitting: NURSE PRACTITIONER

## 2022-09-10 ENCOUNTER — Ambulatory Visit (INDEPENDENT_AMBULATORY_CARE_PROVIDER_SITE_OTHER): Payer: MEDICARE

## 2022-09-10 ENCOUNTER — Other Ambulatory Visit: Payer: Self-pay

## 2022-09-10 ENCOUNTER — Other Ambulatory Visit: Payer: MEDICARE | Attending: Nephrology | Admitting: Nephrology

## 2022-09-10 DIAGNOSIS — E559 Vitamin D deficiency, unspecified: Secondary | ICD-10-CM | POA: Insufficient documentation

## 2022-09-10 DIAGNOSIS — N1832 Chronic kidney disease, stage 3b (CMS HCC): Secondary | ICD-10-CM

## 2022-09-10 DIAGNOSIS — D631 Anemia in chronic kidney disease: Secondary | ICD-10-CM | POA: Insufficient documentation

## 2022-09-10 LAB — COMPREHENSIVE METABOLIC PANEL, NON-FASTING
ALBUMIN/GLOBULIN RATIO: 1.2 (ref 0.8–1.4)
ALBUMIN: 4.1 g/dL (ref 3.5–5.7)
ALKALINE PHOSPHATASE: 54 U/L (ref 34–104)
ALT (SGPT): 18 U/L (ref 7–52)
ANION GAP: 7 mmol/L (ref 4–13)
AST (SGOT): 18 U/L (ref 13–39)
BILIRUBIN TOTAL: 0.6 mg/dL (ref 0.3–1.2)
BUN/CREA RATIO: 12 (ref 6–22)
BUN: 26 mg/dL — ABNORMAL HIGH (ref 7–25)
CALCIUM, CORRECTED: 9 mg/dL (ref 8.9–10.8)
CALCIUM: 9.1 mg/dL (ref 8.6–10.3)
CHLORIDE: 108 mmol/L — ABNORMAL HIGH (ref 98–107)
CO2 TOTAL: 24 mmol/L (ref 21–31)
CREATININE: 2.24 mg/dL — ABNORMAL HIGH (ref 0.60–1.30)
ESTIMATED GFR: 31 mL/min/{1.73_m2} — ABNORMAL LOW (ref >59)
GLOBULIN: 3.3 (ref 2.9–5.4)
GLUCOSE: 150 mg/dL — ABNORMAL HIGH (ref 74–109)
OSMOLALITY, CALCULATED: 285 mOsm/kg (ref 270–290)
POTASSIUM: 4.4 mmol/L (ref 3.5–5.1)
PROTEIN TOTAL: 7.4 g/dL (ref 6.4–8.9)
SODIUM: 139 mmol/L (ref 136–145)

## 2022-09-10 LAB — PHOSPHORUS: PHOSPHORUS: 3 mg/dL — ABNORMAL LOW (ref 3.7–7.2)

## 2022-09-10 LAB — CBC
HCT: 37.4 % (ref 36.7–47.1)
HGB: 12.4 g/dL — ABNORMAL LOW (ref 12.5–16.3)
MCH: 29.1 pg (ref 23.8–33.4)
MCHC: 33 g/dL (ref 32.5–36.3)
MCV: 88.1 fL (ref 73.0–96.2)
MPV: 10.7 fL (ref 7.4–11.4)
PLATELETS: 121 10*3/uL — ABNORMAL LOW (ref 140–440)
RBC: 4.25 10*6/uL (ref 4.06–5.63)
RDW: 15.4 % (ref 12.1–16.2)
WBC: 5.1 10*3/uL (ref 3.6–10.2)

## 2022-09-10 LAB — VITAMIN D 25 TOTAL: VITAMIN D: 39 ng/mL (ref 30–100)

## 2022-09-10 LAB — IRON TRANSFERRIN AND TIBC
IRON (TRANSFERRIN) SATURATION: 12 % — ABNORMAL LOW (ref 20–50)
IRON: 35 ug/dL — ABNORMAL LOW (ref 50–212)
TOTAL IRON BINDING CAPACITY: 301 ug/dL (ref 250–450)
TRANSFERRIN: 215 mg/dL (ref 203–362)
UIBC: 266 ug/dL (ref 130–375)

## 2022-09-10 LAB — FOLATE: FOLATE: >23.8 ng/mL (ref 5.9–24.4)

## 2022-09-10 LAB — VITAMIN B12: VITAMIN B 12: 435 pg/mL (ref 180–914)

## 2022-09-10 LAB — PROTEIN/CREATININE RATIO, URINE, RANDOM
CREATININE RANDOM URINE: 88 mg/dL — ABNORMAL HIGH (ref 11–26)
PROTEIN RANDOM URINE: 8 mg/dL — ABNORMAL LOW (ref 50–80)
PROTEIN/CREATININE RATIO RANDOM URINE: 0.091 mg/mg (ref 0.000–200.000)

## 2022-09-10 LAB — HGA1C (HEMOGLOBIN A1C WITH EST AVG GLUCOSE): HEMOGLOBIN A1C: 6.5 % — ABNORMAL HIGH (ref 4.0–6.0)

## 2022-09-10 LAB — PARATHYROID HORMONE (PTH): PTH: 87.4 pg/mL (ref 12.0–88.0)

## 2022-09-10 LAB — URIC ACID: URIC ACID: 5.7 mg/dL (ref 2.3–7.6)

## 2022-09-18 ENCOUNTER — Ambulatory Visit (INDEPENDENT_AMBULATORY_CARE_PROVIDER_SITE_OTHER): Payer: MEDICARE | Admitting: Nephrology

## 2022-09-18 ENCOUNTER — Other Ambulatory Visit: Payer: Self-pay

## 2022-09-18 VITALS — BP 112/66 | HR 52 | Ht 74.0 in | Wt 270.4 lb

## 2022-09-18 DIAGNOSIS — I509 Heart failure, unspecified: Secondary | ICD-10-CM

## 2022-09-18 DIAGNOSIS — E538 Deficiency of other specified B group vitamins: Secondary | ICD-10-CM

## 2022-09-18 DIAGNOSIS — Z131 Encounter for screening for diabetes mellitus: Secondary | ICD-10-CM

## 2022-09-18 DIAGNOSIS — D631 Anemia in chronic kidney disease: Secondary | ICD-10-CM

## 2022-09-18 DIAGNOSIS — I10 Essential (primary) hypertension: Secondary | ICD-10-CM

## 2022-09-18 DIAGNOSIS — I130 Hypertensive heart and chronic kidney disease with heart failure and stage 1 through stage 4 chronic kidney disease, or unspecified chronic kidney disease: Secondary | ICD-10-CM

## 2022-09-18 DIAGNOSIS — N189 Chronic kidney disease, unspecified: Secondary | ICD-10-CM

## 2022-09-18 DIAGNOSIS — E559 Vitamin D deficiency, unspecified: Secondary | ICD-10-CM

## 2022-09-18 DIAGNOSIS — E1122 Type 2 diabetes mellitus with diabetic chronic kidney disease: Secondary | ICD-10-CM

## 2022-09-18 DIAGNOSIS — N1832 Chronic kidney disease, stage 3b (CMS HCC): Secondary | ICD-10-CM

## 2022-09-18 DIAGNOSIS — N184 Chronic kidney disease, stage 4 (severe): Secondary | ICD-10-CM

## 2022-09-18 NOTE — Progress Notes (Signed)
NEPHROLOGY, [place]  [address]  [place] [address]       Name: Jared Mcintyre MRN:  [id number]   Date of Birth: 11/07/1950 Age: 71 y.o.   Date: 09/18/2022  Time: 14:35       Nephrology Office Note    Reason for visit: Follow Up (F/U   LABS)      History of Present Illness:  Jared Mcintyre  is a 71 y.o. white  gentleman with past medical history of diabetes and hypertension on multiple blood pressure medications presenting to the clinic for follow-up.  Patient denies nausea or vomiting.  No chest pain or shortness of breath.  Patient taking currently the following medication Benicar Coreg Lasix hydralazine, he is not taking Aldactone or amlodipine.  Patient takes also clonidine 0.1 mg at bedtime.  Patient denies nausea or vomiting no chest pain or shortness of breath denies diarrhea.  He reports improvement in the edema.  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 function is better creatinine 1.7 GFR 52 mils per minute anemia of chronic kidney disease hemoglobin 11.9 hemoglobin A1c 5.9 kidney ultrasound showed small bilateral cysts benign anemia profile showed very low iron needs iron supplements twice a day drink plenty of fluids keep sugar controlled keep blood pressure control repeat lab work see him back in 6 months  04-17-2022  Nephrology clinic visit for this patient chronic kidney disease stage 3 kidney function is worse creatinine 2.2 GFR 31 mL/minute blood pressure on the low side will adjust medications drink plenty of fluids avoid NSAIDs keep blood pressure controlled keep sugar controlled repeat lab work see him back in 6 months anemia of chronic kidney disease hemoglobin 11.7 iron saturation is low 16 percent continue iron supplements twice a day vitamin S85 folic acid within normal vitamin-D within normal drink plenty of fluids repeat lab work see him back in 6 months  Patient has no edema in his legs and  his lungs are clear will ask him to cut down Lasix 20 milligram once a day instead of 40 milligram once a day and discuss this with his cardiologist because of worsening kidney function  09-18-2022  Nephrology Clinic follow-up visit on this patient diabetic hypertensive CKD stage IIIB hemoglobin at 12.4 creatinine 2.2 GFR 31 mL/minute stable kidney function anemia chronic kidney disease anemia profile showed iron 12% needs more iron supplements folic acid vitamin I62 within normal vitamin-D within normal drink plenty of fluids avoid NSAIDs keep sugar blood pressure control repeat lab work see him back in 6 months   .   Marland Kitchen    Past Medical History:  Past Medical History:   Diagnosis Date    Congestive heart failure (CMS HCC)     Diabetes mellitus, type 2 (CMS HCC)     Essential hypertension          Past Surgical History:  Past Surgical History:   Procedure Laterality Date    HX BACK SURGERY      HX COLONOSCOPY      HX ELBOW SURGERY Right     Release of Tendon    HX HAND SURGERY           Allergies:  Allergies   Allergen Reactions    Indomethacin  Other Adverse Reaction (Add comment) and Swelling     Medications:  Current Outpatient Medications   Medication Sig    allopurinoL (ZYLOPRIM) 100 mg  Oral Tablet Every one hour    amLODIPine (NORVASC) 5 mg Oral Tablet Take 1 Tablet (5 mg total) by mouth    aspirin (ECOTRIN) 81 mg Oral Tablet, Delayed Release (E.C.) Take 1 Tablet (81 mg total) by mouth    calcium carbonate-vitamin D3 500 mg-5 mcg (200 unit) Oral Tablet Take 1 Tablet by mouth Once a day    carvediloL (COREG) 6.25 mg Oral Tablet Take 1 Tablet (6.25 mg total) by mouth    colchicine 0.6 mg Oral Tablet Take 1 Tablet (0.6 mg total) by mouth    cyanocobalamin (VITAMIN B-12) 500 mcg Oral Tablet Take 1 Tablet (500 mcg total) by mouth    ergocalciferol, vitamin D2, (DRISDOL) 1,250 mcg (50,000 unit) Oral Capsule Vitamin D2    ferrous sulfate (FERATAB) 324 mg (65 mg iron) Oral Tablet, Delayed Release (E.C.) Take 1 Tablet  (324 mg total) by mouth    furosemide (LASIX) 1 mg/mL Intravenous Injectable furosemide    gabapentin (NEURONTIN) 100 mg Oral Capsule Take 1 Capsule (100 mg total) by mouth    hydrALAZINE (APRESOLINE) 100 mg Oral Tablet Take 1 Tablet (100 mg total) by mouth    isosorbide mononitrate (IMDUR) 30 mg Oral Tablet Sustained Release 24 hr Take 1 Tablet (30 mg total) by mouth Every morning    olmesartan (BENICAR) 40 mg Oral Tablet olmesartan 40 mg tablet    omega 3-dha-epa-fish oil 183.3 mg-75 mg -91.6 mg-306 mg Oral Capsule omega 3 183.3 mg-dha 75 mg-epa 91.6 mg-fish oil 306 mg capsule   Take by oral route.    omega-3 fatty acid (LOVAZA) 1 gram Oral Capsule Take 2 Capsules (2 g total) by mouth    rosuvastatin (CRESTOR) 10 mg Oral Tablet rosuvastatin 10 mg tablet   TAKE 1 TABLET BY MOUTH EVERY DAY AT BEDTIME    traZODone (DESYREL) 100 mg Oral Tablet trazodone 100 mg tablet   Take 1 tablet by oral route at bedtime.    Vitamin B12-Folic Acid 8.6-7 mg Oral Tablet Vitamin B-12     Family History:  Family Medical History:       Problem Relation (Age of Onset)    COPD Other    Cancer Father    Diabetes Mother    Diabetes type II Other    Elevated Lipids Other    Hypertension (High Blood Pressure) Brother, Other    Stroke Other    Transient ischemic attack Other            Social History:  Social History     Socioeconomic History    Marital status: Married   Tobacco Use    Smoking status: Former     Types: Cigarettes     Quit date: 2003     Years since quitting: 20.9    Smokeless tobacco: Never   Substance and Sexual Activity    Alcohol use: Never    Drug use: Never       Review of Systems:  Constitutional: negative for fevers, chills, sweats, and fatigue  Eyes: negative for visual disturbance, irritation, redness, and icterus  Ears, nose, mouth, throat, and face: negative for hearing loss, ear drainage, nasal congestion, epistaxis  Respiratory: negative for cough, sputum, hemoptysis, wheezing, or dyspnea on  exertion  Cardiovascular: negative for chest pain, palpitations, syncope, orthopnea, paroxysmal nocturnal dyspnea, and   Gastrointestinal: negative for dysphagia, nausea, vomiting, melena, diarrhea, constipation, and abdominal pain  Genitourinary:negative for frequency, dysuria, nocturia, urinary incontinence, hesitancy, and hematuria  Integument/breast: negative for rash, skin lesion(s),  and pruritus  Hematologic/lymphatic: negative for easy bruising, bleeding, and petechiae  Musculoskeletal:negative for myalgias, arthralgias, neck pain, back pain, no lower extremity edema, and muscle weakness  Neurological: negative for headaches, dizziness, seizures, speech problems, tremor, and weakness  Behavioral/Psych: negative for anxiety, behavior problems, mood swings, and sleep disturbance  Endocrine: negative for temperature intolerance  Allergic/Immunologic: negative for urticaria and angioedema    Physical Exam:  Vitals:    09/18/22 1359   BP: 112/66   Pulse: 52   Weight: 123 kg (270 lb 6.4 oz)   Height: 1.88 m (6\' 2" )   BMI: 34.79      Constitutional: Alert and Oriented, no distress,   HEENT : normocephalic , atraumatic vision and hearing grossly normal   Eyes: Conjunctiva clear., Pupils equal and round, reactive to light and accomodation. , Sclera non-icteric.   ENT: Nose without erythema. , Mouth mucous membranes moist.   Neck: no thyromegaly or lymphadenopathy and supple, symmetrical, trachea midline  Respiratory: Clear to auscultation bilaterally. No wheezes, No rales, Good air exchange bilaterally  Cardiovascular: regular rate and rhythm no murmurs no rub  Gastrointestinal: Soft, non-tender, Bowel sounds normal, No hepatosplenomegaly  Genitourinary: no suprapubic tenderness  Lower Extremities: No edema, Pulses +4  Neurologic: Grossly normal. Speech clear.   Psychiatric: Normal mood and affect     Lab:  Lab Results   Component Value Date    BUN 26 (H) 09/10/2022    BUN 39 (H) 08/15/2022    BUN 30 (H) 04/10/2022     BUN 30 (H) 04/10/2022    CREATININE 2.24 (H) 09/10/2022    CREATININE 2.44 (H) 08/15/2022    CREATININE 2.23 (H) 04/10/2022    CREATININE 2.23 (H) 04/10/2022    BUNCRRATIO 12 09/10/2022    BUNCRRATIO 16 08/15/2022    BUNCRRATIO 13 04/10/2022    GFR 31 (L) 09/10/2022    GFR 28 (L) 08/15/2022    GFR 31 (L) 04/10/2022    GFR 31 (L) 04/10/2022    SODIUM 139 09/10/2022    SODIUM 137 08/15/2022    SODIUM 135 (L) 04/10/2022    SODIUM 135 (L) 04/10/2022    POTASSIUM 4.4 09/10/2022    POTASSIUM 4.5 08/15/2022    POTASSIUM 4.8 04/10/2022    POTASSIUM 4.8 04/10/2022    CHLORIDE 108 (H) 09/10/2022    CHLORIDE 105 08/15/2022    CHLORIDE 101 04/10/2022    CHLORIDE 101 04/10/2022    CO2 24 09/10/2022    CO2 25 08/15/2022    CO2 29 04/10/2022    CO2 29 04/10/2022    ANIONGAP 7 09/10/2022    ANIONGAP 7 08/15/2022    ANIONGAP 5 (L) 04/10/2022    ANIONGAP 5 (L) 04/10/2022    CALCIUM 9.1 09/10/2022    CALCIUM 9.0 08/15/2022    CALCIUM 9.2 04/10/2022    CALCIUM 9.2 04/10/2022    PHOSPHORUS 3.0 (L) 09/10/2022    PHOSPHORUS 3.1 (L) 04/10/2022    ALBUMIN 4.1 09/10/2022    ALBUMIN 4.1 08/15/2022    ALBUMIN 4.1 04/10/2022    ALBUMIN 4.1 04/10/2022    HGB 12.4 (L) 09/10/2022    HGB 12.9 08/15/2022    HGB 11.7 (L) 04/10/2022    HCT 37.4 09/10/2022    HCT 39.3 08/15/2022    HCT 35.6 (L) 04/10/2022    INTACTPTH 87.4 09/10/2022    INTACTPTH 71.2 04/10/2022    IRON 35 (L) 09/10/2022    IRON 53 04/10/2022    IRONBINDCAP 301 09/10/2022  IRONBINDCAP 337 04/10/2022    IRONSAT 12 (L) 09/10/2022    IRONSAT 16 (L) 04/10/2022    FERRITIN 95 04/10/2022       Lab Results   Component Value Date    HA1C 6.5 (H) 09/10/2022    HA1C 6.8 (H) 04/10/2022    URICACID 5.7 09/10/2022    URICACID 5.2 04/10/2022        Assessment and Plan:  ENCOUNTER DIAGNOSES     ICD-10-CM   1. Stage 3b chronic kidney disease (CMS [facility])  N18.32   2. Anemia in stage 3b chronic kidney disease (CMS HCC)   N18.32    D63.1   3. Vitamin D deficiency  E55.9   4. Essential (primary)  hypertension  I10   5. Type 2 diabetes mellitus with stage 3b chronic kidney disease, without long-term current use of insulin (CMS HCC)  E11.22    N18.32   6. Congestive heart failure, unspecified HF chronicity, unspecified heart failure type (CMS HCC)  I50.9   7. Diabetes mellitus screening  Z13.1   8. Vitamin B12 deficiency  E53.8   9. Anemia in chronic kidney disease, unspecified CKD stage  N18.9    D63.1        Plan:  CKD stage 4 stable kidney function drink plenty of fluids avoid NSAIDs keep sugar blood pressure control repeat lab work see him back in 6 months  Anemia chronic kidney disease increase iron supplements  Vitamin-D deficiency continue supplement  Vitamin B12 deficiency continue supplements                     No follow-ups on file.    Richardean Sale, MD     Portions of this note may be dictated using voice recognition software or a dictation service. Variances in spelling and vocabulary are possible and unintentional. Not all errors are caught/corrected. Please notify the [facility] if any discrepancies are noted or if the meaning of any statement is not clear. ,b

## 2022-10-11 ENCOUNTER — Other Ambulatory Visit (INDEPENDENT_AMBULATORY_CARE_PROVIDER_SITE_OTHER): Payer: MEDICARE

## 2022-10-11 ENCOUNTER — Other Ambulatory Visit: Payer: MEDICARE

## 2022-10-11 ENCOUNTER — Other Ambulatory Visit: Payer: Self-pay

## 2022-10-11 DIAGNOSIS — I10 Essential (primary) hypertension: Secondary | ICD-10-CM

## 2022-10-11 DIAGNOSIS — E538 Deficiency of other specified B group vitamins: Secondary | ICD-10-CM | POA: Insufficient documentation

## 2022-10-11 DIAGNOSIS — N189 Chronic kidney disease, unspecified: Secondary | ICD-10-CM | POA: Insufficient documentation

## 2022-10-11 DIAGNOSIS — D631 Anemia in chronic kidney disease: Secondary | ICD-10-CM | POA: Insufficient documentation

## 2022-10-11 DIAGNOSIS — N1832 Chronic kidney disease, stage 3b (CMS HCC): Secondary | ICD-10-CM | POA: Insufficient documentation

## 2022-10-11 DIAGNOSIS — E1122 Type 2 diabetes mellitus with diabetic chronic kidney disease: Secondary | ICD-10-CM | POA: Insufficient documentation

## 2022-10-11 DIAGNOSIS — E559 Vitamin D deficiency, unspecified: Secondary | ICD-10-CM | POA: Insufficient documentation

## 2022-10-11 DIAGNOSIS — Z131 Encounter for screening for diabetes mellitus: Secondary | ICD-10-CM | POA: Insufficient documentation

## 2022-10-11 LAB — COMPREHENSIVE METABOLIC PANEL, NON-FASTING
ALBUMIN/GLOBULIN RATIO: 1.2 (ref 0.8–1.4)
ALBUMIN: 4.1 g/dL (ref 3.5–5.7)
ALKALINE PHOSPHATASE: 63 U/L (ref 34–104)
ALT (SGPT): 18 U/L (ref 7–52)
ANION GAP: 8 mmol/L (ref 4–13)
AST (SGOT): 22 U/L (ref 13–39)
BILIRUBIN TOTAL: 0.4 mg/dL (ref 0.3–1.2)
BUN/CREA RATIO: 15 (ref 6–22)
BUN: 29 mg/dL — ABNORMAL HIGH (ref 7–25)
CALCIUM, CORRECTED: 9.4 mg/dL (ref 8.9–10.8)
CALCIUM: 9.5 mg/dL (ref 8.6–10.3)
CHLORIDE: 103 mmol/L (ref 98–107)
CO2 TOTAL: 25 mmol/L (ref 21–31)
CREATININE: 1.96 mg/dL — ABNORMAL HIGH (ref 0.60–1.30)
ESTIMATED GFR: 36 mL/min/{1.73_m2} — ABNORMAL LOW (ref >59)
GLOBULIN: 3.4 (ref 2.9–5.4)
GLUCOSE: 164 mg/dL — ABNORMAL HIGH (ref 74–109)
OSMOLALITY, CALCULATED: 282 mOsm/kg (ref 270–290)
POTASSIUM: 4.4 mmol/L (ref 3.5–5.1)
PROTEIN TOTAL: 7.5 g/dL (ref 6.4–8.9)
SODIUM: 136 mmol/L (ref 136–145)

## 2022-10-11 LAB — PARATHYROID HORMONE (PTH): PTH: 62.8 pg/mL (ref 12.0–88.0)

## 2022-10-11 LAB — URIC ACID: URIC ACID: 5 mg/dL (ref 2.3–7.6)

## 2022-10-11 LAB — CBC
HCT: 42.2 % (ref 36.7–47.1)
HGB: 13.9 g/dL (ref 12.5–16.3)
MCH: 28.9 pg (ref 23.8–33.4)
MCHC: 32.9 g/dL (ref 32.5–36.3)
MCV: 87.7 fL (ref 73.0–96.2)
MPV: 9.5 fL (ref 7.4–11.4)
PLATELETS: 119 10*3/uL — ABNORMAL LOW (ref 140–440)
RBC: 4.82 10*6/uL (ref 4.06–5.63)
RDW: 15.9 % (ref 12.1–16.2)
WBC: 5.7 10*3/uL (ref 3.6–10.2)

## 2022-10-11 LAB — IRON TRANSFERRIN AND TIBC
IRON (TRANSFERRIN) SATURATION: 21 % (ref 20–50)
IRON: 60 ug/dL (ref 50–212)
TOTAL IRON BINDING CAPACITY: 290 ug/dL (ref 250–450)
TRANSFERRIN: 207 mg/dL (ref 203–362)
UIBC: 230 ug/dL (ref 130–375)

## 2022-10-11 LAB — MAGNESIUM: MAGNESIUM: 2 mg/dL (ref 1.9–2.7)

## 2022-10-11 LAB — HGA1C (HEMOGLOBIN A1C WITH EST AVG GLUCOSE): HEMOGLOBIN A1C: 6.5 % — ABNORMAL HIGH (ref 4.0–6.0)

## 2022-10-11 LAB — FOLATE: FOLATE: 23.4 ng/mL (ref 5.9–24.4)

## 2022-10-11 LAB — VITAMIN D 25 TOTAL: VITAMIN D: 48 ng/mL (ref 30–100)

## 2022-10-11 LAB — VITAMIN B12: VITAMIN B 12: 665 pg/mL (ref 180–914)

## 2022-10-11 LAB — PHOSPHORUS: PHOSPHORUS: 3.7 mg/dL (ref 3.7–7.2)

## 2022-10-17 ENCOUNTER — Other Ambulatory Visit: Payer: Self-pay

## 2022-10-17 ENCOUNTER — Ambulatory Visit: Payer: MEDICARE | Attending: NURSE PRACTITIONER | Admitting: NURSE PRACTITIONER

## 2022-10-17 ENCOUNTER — Encounter (HOSPITAL_COMMUNITY): Payer: Self-pay | Admitting: NURSE PRACTITIONER

## 2022-10-17 VITALS — BP 116/57 | HR 62 | Temp 97.3°F | Ht 74.0 in | Wt 274.3 lb

## 2022-10-17 DIAGNOSIS — Z87891 Personal history of nicotine dependence: Secondary | ICD-10-CM | POA: Insufficient documentation

## 2022-10-17 DIAGNOSIS — Z862 Personal history of diseases of the blood and blood-forming organs and certain disorders involving the immune mechanism: Secondary | ICD-10-CM

## 2022-10-17 DIAGNOSIS — D693 Immune thrombocytopenic purpura: Secondary | ICD-10-CM | POA: Insufficient documentation

## 2022-10-17 DIAGNOSIS — Z809 Family history of malignant neoplasm, unspecified: Secondary | ICD-10-CM | POA: Insufficient documentation

## 2022-10-17 NOTE — Cancer Center Note (Signed)
Department of Hematology/Oncology  History and Physical    Name: Jared Mcintyre  BDZ:[id number]  Date of Birth: 02/12/1951  Encounter Date: 10/17/2022    REFERRING PROVIDER:  No referring provider defined for this encounter.    REASON FOR OFFICE VISIT:   Follow up on ITP.    HISTORY OF PRESENT ILLNESS:  Jared Mcintyre is a 72 y.o. 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  Patient had labs completed on 10/11/22.  The patient offers no new complaints at this time.  The patient has had a good appetite and stable weight.  The patient has not noted any new areas of disease on own self exam.  The patient has not had any chest pain or dyspnea.  The patient has not had any headaches or changes in vision.  The patient has not had any abdominal pain, nausea, or vomiting.  There have been no changes in bowel or bladder habits.  The patient has not had any abnormal bleeding or clotting episodes.  There have been no complaints of fever, chills, cough, sputum production, dysuria, or diarrhea.          ROS:   Review of Systems   Constitutional:  Positive for chills. Negative for fatigue and fever.   HENT:  Negative.     Eyes: Negative.    Respiratory:  Negative for shortness of breath.    Cardiovascular:  Negative for chest pain.   Gastrointestinal:  Negative for abdominal pain, diarrhea, nausea and vomiting.   Endocrine: Negative.    Genitourinary:  Negative for difficulty urinating and hematuria.    Musculoskeletal:  Positive for back pain (mid lower back from previous injury). Negative for arthralgias.   Neurological:  Positive for dizziness (often). Negative for headaches.   Hematological:  Negative for adenopathy. Does not bruise/bleed easily.   Psychiatric/Behavioral: Negative.          History:  Past Medical History:   Diagnosis Date    Congestive heart failure (CMS HCC)     Diabetes mellitus, type 2 (CMS HCC)     Essential hypertension            Past Surgical History:   Procedure Laterality Date    HX BACK SURGERY      HX  COLONOSCOPY      HX ELBOW SURGERY Right     Release of Tendon    HX HAND SURGERY             Social History     Socioeconomic History    Marital status: Married     Spouse name: Not on file    Number of children: Not on file    Years of education: Not on file    Highest education level: Not on file   Occupational History    Not on file   Tobacco Use    Smoking status: Former     Types: Cigarettes     Quit date: 2003     Years since quitting: 21.0    Smokeless tobacco: Never   Substance and Sexual Activity    Alcohol use: Never    Drug use: Never    Sexual activity: Not on file   Other Topics Concern    Not on file   Social History Narrative    Not on file     Social Determinants of Health     Financial Resource Strain: Not on file   Transportation Needs: Not on file  Social Connections: Not on file   Intimate Partner Violence: Not on file   Housing Stability: Not on file       Social History     Social History Narrative    Not on file       Social History     Substance and Sexual Activity   Drug Use Never       Family Medical History:       Problem Relation (Age of Onset)    COPD Other    Cancer Father    Diabetes Mother    Diabetes type II Other    Elevated Lipids Other    Hypertension (High Blood Pressure) Brother, Other    Stroke Other    Transient ischemic attack Other              Current Outpatient Medications   Medication Sig    allopurinoL (ZYLOPRIM) 100 mg Oral Tablet Every one hour    amLODIPine (NORVASC) 5 mg Oral Tablet Take 1 Tablet (5 mg total) by mouth    aspirin (ECOTRIN) 81 mg Oral Tablet, Delayed Release (E.C.) Take 1 Tablet (81 mg total) by mouth    calcium carbonate-vitamin D3 500 mg-5 mcg (200 unit) Oral Tablet Take 1 Tablet by mouth Once a day    carvediloL (COREG) 6.25 mg Oral Tablet Take 1 Tablet (6.25 mg total) by mouth    cyanocobalamin (VITAMIN B-12) 500 mcg Oral Tablet Take 1 Tablet (500 mcg total) by mouth    dapagliflozin propanediol (FARXIGA) 10 mg Oral Tablet Take 1 Tablet (10 mg  total) by mouth Once a day    ergocalciferol, vitamin D2, (DRISDOL) 1,250 mcg (50,000 unit) Oral Capsule Vitamin D2    ferrous sulfate (FERATAB) 324 mg (65 mg iron) Oral Tablet, Delayed Release (E.C.) Take 1 Tablet (324 mg total) by mouth    folic acid (FOLVITE) 1 mg Oral Tablet Take 1 Tablet (1 mg total) by mouth Once a day    furosemide (LASIX) 1 mg/mL Intravenous Injectable furosemide    gabapentin (NEURONTIN) 100 mg Oral Capsule Take 1 Capsule (100 mg total) by mouth    hydrALAZINE (APRESOLINE) 100 mg Oral Tablet Take 1 Tablet (100 mg total) by mouth    isosorbide mononitrate (IMDUR) 30 mg Oral Tablet Sustained Release 24 hr Take 1 Tablet (30 mg total) by mouth Every morning    linaGLIPtin (TRADJENTA) 5 mg Oral Tablet Take 1 Tablet (5 mg total) by mouth Once a day    MetFORMIN (GLUCOPHAGE) 1,000 mg Oral Tablet Take 1 tablet twice a day by oral route.    olmesartan (BENICAR) 40 mg Oral Tablet olmesartan 40 mg tablet    omega 3-dha-epa-fish oil 183.3 mg-75 mg -91.6 mg-306 mg Oral Capsule omega 3 183.3 mg-dha 75 mg-epa 91.6 mg-fish oil 306 mg capsule   Take by oral route.    rosuvastatin (CRESTOR) 10 mg Oral Tablet rosuvastatin 10 mg tablet   TAKE 1 TABLET BY MOUTH EVERY DAY AT BEDTIME    traZODone (DESYREL) 100 mg Oral Tablet trazodone 100 mg tablet   Take 1 tablet by oral route at bedtime.    Vitamin B12-Folic Acid 2.8-0 mg Oral Tablet Vitamin B-12       Allergies   Allergen Reactions    Indomethacin  Other Adverse Reaction (Add comment) and Swelling         PHYSICAL EXAM:  BP (!) 116/57 (Site: Left, Patient Position: Sitting, Cuff Size: Adult)   Pulse 62   Temp  36.3 C (97.3 F) (Temporal)   Ht 1.88 m (6\' 2" )   Wt 124 kg (274 lb 4.8 oz)   SpO2 100%   BMI 35.22 kg/m        ECOG Status: (0) Fully active, able to carry on all predisease performance without restriction    Physical Exam  Vitals reviewed.   Constitutional:       Appearance: Normal appearance. He is normal weight.   Eyes:       Conjunctiva/sclera: Conjunctivae normal.      Pupils: Pupils are equal, round, and reactive to light.   Neck:      Thyroid: No thyroid mass, thyromegaly or thyroid tenderness.   Cardiovascular:      Rate and Rhythm: Normal rate and regular rhythm.      Pulses: Normal pulses.      Heart sounds: Normal heart sounds, S1 normal and S2 normal. No murmur heard.     No S3 or S4 sounds.   Pulmonary:      Effort: Pulmonary effort is normal.      Breath sounds: Normal breath sounds.   Abdominal:      General: Bowel sounds are normal.      Palpations: Abdomen is soft.   Musculoskeletal:         General: Normal range of motion.      Cervical back: Normal range of motion and neck supple.      Right lower leg: No edema.      Left lower leg: No edema.   Lymphadenopathy:      Head:      Right side of head: No submental, submandibular, preauricular, posterior auricular or occipital adenopathy.      Left side of head: No submental, submandibular, preauricular, posterior auricular or occipital adenopathy.      Cervical: No cervical adenopathy.      Right cervical: No superficial, deep or posterior cervical adenopathy.     Left cervical: No superficial, deep or posterior cervical adenopathy.      Upper Body:      Right upper body: No supraclavicular or axillary adenopathy.      Left upper body: No supraclavicular or axillary adenopathy.      Lower Body: No right inguinal adenopathy. No left inguinal adenopathy.   Skin:     General: Skin is warm and dry.      Capillary Refill: Capillary refill takes less than 2 seconds.   Neurological:      General: No focal deficit present.      Mental Status: He is alert and oriented to person, place, and time. Mental status is at baseline.      Motor: Motor function is intact.      Coordination: Coordination is intact.      Gait: Gait is intact.   Psychiatric:         Attention and Perception: Attention and perception normal.         Mood and Affect: Mood and affect normal.         Speech: Speech  normal.         Behavior: Behavior normal. Behavior is cooperative.         Thought Content: Thought content normal.         Cognition and Memory: Cognition and memory normal.         Judgment: Judgment normal.          LABS:   CBC  Diff   Lab Results  Component Value Date/Time    WBC 5.7 10/11/2022 09:38 AM    HGB 13.9 10/11/2022 09:38 AM    HCT 42.2 10/11/2022 09:38 AM    PLTCNT 119 (L) 10/11/2022 09:38 AM    RBC 4.82 10/11/2022 09:38 AM    MCV 87.7 10/11/2022 09:38 AM    MCHC 32.9 10/11/2022 09:38 AM    MCH 28.9 10/11/2022 09:38 AM    RDW 15.9 10/11/2022 09:38 AM    MPV 9.5 10/11/2022 09:38 AM    Lab Results   Component Value Date/Time    PMNS 63 08/15/2022 09:24 AM    LYMPHOCYTES 24 08/15/2022 09:24 AM    EOSINOPHIL 3 08/15/2022 09:24 AM    MONOCYTES 9 08/15/2022 09:24 AM    BASOPHILS 1 08/15/2022 09:24 AM    BASOPHILS 0.00 08/15/2022 09:24 AM    PMNABS 3.40 08/15/2022 09:24 AM    LYMPHSABS 1.30 08/15/2022 09:24 AM    EOSABS 0.20 08/15/2022 09:24 AM    MONOSABS 0.50 08/15/2022 09:24 AM            Comprehensive Metabolic Profile    Lab Results   Component Value Date    SODIUM 136 10/11/2022    POTASSIUM 4.4 10/11/2022    CHLORIDE 103 10/11/2022    CO2 25 10/11/2022    ANIONGAP 8 10/11/2022    BUN 29 (H) 10/11/2022    CREATININE 1.96 (H) 10/11/2022    ALBUMIN 4.1 10/11/2022    CALCIUM 9.5 10/11/2022    GLUCOSENF 164 (H) 10/11/2022    ALKPHOS 63 10/11/2022    ALT 18 10/11/2022    AST 22 10/11/2022    TOTBILIRUBIN 0.4 10/11/2022    TOTALPROTEIN 7.5 10/11/2022            IRON   Date Value Ref Range Status   10/11/2022 60 50 - 212 ug/dL Final     FERRITIN   Date Value Ref Range Status   04/10/2022 95 11 - 336 ng/mL Final     TOTAL IRON BINDING CAPACITY   Date Value Ref Range Status   10/11/2022 290 250 - 450 ug/dL Final     UIBC   Date Value Ref Range Status   10/11/2022 230 130 - 375 ug/dL Final     IRON (TRANSFERRIN) SATURATION   Date Value Ref Range Status   10/11/2022 21 20 - 50 % Final     VITAMIN B 12   Date  Value Ref Range Status   10/11/2022 665 180 - 914 pg/mL Final     FOLATE   Date Value Ref Range Status   10/11/2022 23.4 5.9 - 24.4 ng/mL Final         ASSESSMENT:    ICD-10-CM    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 All relative external and internal medical records were reviewed including available H&Ps, progress notes, procedure notes, imaging's, laboratories, and pathology.   2. Thrombocytopenia:  Patient is stable and asymptomatic at this time.   I will see him in 6 months, sooner if needed.   3.   DACIAN ORRICO was given the chance to ask questions, and these were answered to their satisfaction. The patient is welcome to call with any questions or concerns in the meantime.     On the day of the encounter, a total of  16 minutes was spent on this patient encounter including review of  historical information, examination, documentation and post-visit activities.     Return in about 6 months (around 04/17/2023).     Patrcia Dolly APRN, FNP-BC, AOCNP, 10/17/2022 , 15:32     You can see your note(s) in [facility]. It is common for you to encounter certain medical terminology which may be unfamiliar to you. You might see results before your provider does so please give at least 2 business days for review. Please have this understanding, that NOT all abnormal results are significant. Our office will contact you for any urgent or emergent action if necessary. If you have any questions or concerns, feel free to send a MyChart message or call the office. Please call with any new or concerning symptoms.     CC:  Gearlean Alf, MD  [place] [phone]    No referring provider defined for this encounter.      This note was partially generated using MModal Fluency Direct system, and there may be some incorrect words, spellings, and punctuation that were not noted in checking the note before saving.

## 2022-10-18 ENCOUNTER — Encounter (INDEPENDENT_AMBULATORY_CARE_PROVIDER_SITE_OTHER): Payer: Self-pay | Admitting: Nephrology

## 2022-10-22 ENCOUNTER — Encounter (INDEPENDENT_AMBULATORY_CARE_PROVIDER_SITE_OTHER): Payer: Self-pay | Admitting: Student in an Organized Health Care Education/Training Program

## 2022-10-22 DIAGNOSIS — N400 Enlarged prostate without lower urinary tract symptoms: Secondary | ICD-10-CM

## 2022-10-22 DIAGNOSIS — Z125 Encounter for screening for malignant neoplasm of prostate: Secondary | ICD-10-CM

## 2022-10-22 NOTE — Progress Notes (Signed)
72 yo male patient.    PCP: Dr. 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  His last PSA dated 04/08/2020 was normal at 2.4.  The patient is not on any treatment for his LUTS.  He has nocturia 0-1 time nightly.  He denies hesitancy and claims to void good force of stream.  He denies any episodes of dysuria or gross hematuria since his last visit.  He does have ED for which he takes sildenafil.  He is currently only taking 20 mg but does not obtain a very firm erection at that dosage.        He is a former patient of Dr. Shaaron Adler. He has a history of left hydrocele, BPH, left epididymitis, and ED.  He underwent left hydrocelectomy 05/2015.    He denies a history of stone disease.    He denies a family history of prostate cancer.    DRE was done 03/2019  03/30/19 PSA: 2.8 ng/mL  12/26/17 PSA: 2.31 ng/mL  01/03/17 PSA: 2.25 ng/mL  12/15/15 PSA: 2.1 ng/mL with 25.7% free  12/2014: 3.18 ng/mL  11/2013: 1.71 ng/mL  10/2012: 2.42 ng/mL  03/2011: 1.8 ng/mL  09/2009: 2.02 ng/mL    PCa3 04/2015 was 28.    He denies dysuria, hematuria, and lower urinary tract symptoms. PVR was 5 mL 08/2016.    He has used sildenafil for ED in the past but is not using this currently due to BP/HR issues. He sees Dr. Johnn Hai.    09/30/18 UA showed small leuks, and [place] PVR was 65 mL. He was asymptomatic.    He prefers follow-up every 6 months.  Today 04/21/2020.  He is coming for follow-up of BPH.  PSA is 2.4.  No urinary symptoms of nocturia dysuria urgency frequency or hematuria.  His rectal exam is normal prostate no hard nodules.    10/31/2021:  Mr. Schirtzinger presents for f/.u. Most reecent PSA was 2.19 on 10/19/2021. HBe denies a fmaily history of prostate cancer. He denies urinary complaints such as hematuria, dysuria, incontinence, sensation incomplete bladder emptying, nocturia.  He has a history of prostate biopsy.  He denies a history of urologic surgery.  He denies any fevers, chills, nausea  vomiting    Assessment & Plan  (1) Anemia in stage 3a chronic kidney disease:        Status: Acute        Code(s):  N18.31 - Chronic kidney disease, stage 3a; D63.1 - Anemia in chronic kidney disease    Plan  Erectile dysfunction: Continue Viagra 100 mg 1 hour before sexual activity and empty stomach    Prostate cancer screening  PSA 1 year    Follow-up in 1 year

## 2022-10-31 ENCOUNTER — Other Ambulatory Visit: Payer: MEDICARE | Attending: Student in an Organized Health Care Education/Training Program

## 2022-10-31 ENCOUNTER — Other Ambulatory Visit: Payer: Self-pay

## 2022-10-31 DIAGNOSIS — Z131 Encounter for screening for diabetes mellitus: Secondary | ICD-10-CM | POA: Insufficient documentation

## 2022-10-31 DIAGNOSIS — N1832 Chronic kidney disease, stage 3b (CMS HCC): Secondary | ICD-10-CM | POA: Insufficient documentation

## 2022-10-31 DIAGNOSIS — Z125 Encounter for screening for malignant neoplasm of prostate: Secondary | ICD-10-CM | POA: Insufficient documentation

## 2022-10-31 DIAGNOSIS — N400 Enlarged prostate without lower urinary tract symptoms: Secondary | ICD-10-CM | POA: Insufficient documentation

## 2022-10-31 LAB — PROTEIN/CREATININE RATIO, URINE, RANDOM
CREATININE RANDOM URINE: 73 mg/dL — ABNORMAL HIGH (ref 11–26)
PROTEIN RANDOM URINE: 5 mg/dL — ABNORMAL LOW (ref 50–80)
PROTEIN/CREATININE RATIO RANDOM URINE: 0.068 mg/mg (ref 0.000–200.000)

## 2022-10-31 LAB — MICROALBUMIN URINE, RANDOM: MICROALBUMIN RANDOM URINE: <0.2 mg/dL

## 2022-10-31 LAB — PSA, DIAGNOSTIC: PSA: 4.02 ng/mL — ABNORMAL HIGH (ref <=4.00)

## 2022-11-06 ENCOUNTER — Other Ambulatory Visit: Payer: Self-pay

## 2022-11-06 ENCOUNTER — Encounter (INDEPENDENT_AMBULATORY_CARE_PROVIDER_SITE_OTHER): Payer: Self-pay | Admitting: Student in an Organized Health Care Education/Training Program

## 2022-11-06 ENCOUNTER — Ambulatory Visit (INDEPENDENT_AMBULATORY_CARE_PROVIDER_SITE_OTHER): Payer: MEDICARE | Admitting: Student in an Organized Health Care Education/Training Program

## 2022-11-06 ENCOUNTER — Other Ambulatory Visit (INDEPENDENT_AMBULATORY_CARE_PROVIDER_SITE_OTHER): Payer: Self-pay | Admitting: Nephrology

## 2022-11-06 VITALS — BP 130/64 | HR 62 | Ht 74.0 in | Wt 278.0 lb

## 2022-11-06 DIAGNOSIS — N400 Enlarged prostate without lower urinary tract symptoms: Secondary | ICD-10-CM

## 2022-11-06 DIAGNOSIS — N529 Male erectile dysfunction, unspecified: Secondary | ICD-10-CM

## 2022-11-06 DIAGNOSIS — N401 Enlarged prostate with lower urinary tract symptoms: Secondary | ICD-10-CM

## 2022-11-06 DIAGNOSIS — R972 Elevated prostate specific antigen [PSA]: Secondary | ICD-10-CM

## 2022-11-06 MED ORDER — FERROUS SULFATE 324 MG (65 MG IRON) TABLET,DELAYED RELEASE
324.0000 mg | DELAYED_RELEASE_TABLET | Freq: Every day | ORAL | 2 refills | Status: DC
Start: 2022-11-06 — End: 2022-12-05

## 2022-11-06 MED ORDER — TAMSULOSIN 0.4 MG CAPSULE
0.4000 mg | ORAL_CAPSULE | Freq: Every evening | ORAL | 11 refills | Status: DC
Start: 2022-11-06 — End: 2023-12-25

## 2022-11-06 NOTE — Telephone Encounter (Signed)
Sent Ferrous Sulfate 325 mg to [facility] on 11/06/22 12:59 per Dr. Darius Bump

## 2022-11-06 NOTE — Progress Notes (Signed)
[facility]    Progress Note    Name: Jared Mcintyre MRN:  [id number]   Date: 11/06/2022 Age: 72 y.o.       Chief Complaint: Follow Up (BPH/LUTS)    Subjective:   Jared Mcintyre presents for f/.u. Most reecent PSA was 2.19 on 10/19/2021. Patient denies a fmaily history of urinary malignancies. He denies urinary complaints such as hematuria, dysuria, incontinence, sensation incomplete bladder emptying, nocturia, dribbling, hesitancy.Patient states he has a strong urine stream.  He has not had a history of prostate biopsy.  He denies a history of urologic surgery.  He denies any fevers, chills, nausea, vomiting, or abdominal pain.Remote history of tobacco use. Patient denies occupational chemical exposure. Patient states Viagra continues to help obtain and maintain erections.      Objective :  BP 130/64 (Site: Right, Patient Position: Sitting, Cuff Size: Adult)   Pulse 62   Ht 1.88 m (6' 2"$ )   Wt 126 kg (278 lb)   BMI 35.69 kg/m       Gen: NAD, alert  Pulm: unlabored at rest  CV: palpable pulses  Abd: soft, Nt/ND  GU: no suprapubic tenderness, no CVAT, prostate smooth non-tender 50cc no nodules      Data reviewed:  PSA:     10-31-22  4.02     01-/26/23  2.19    03/30/19  2.8   12/26/17  2.31   01/03/17  2.25   12/15/15   2.1   12/2014:  3.18   11/2013:  1.71   10/2012:  2.42   03/2011:  1.8   09/2009:  2.02     Current Outpatient Medications   Medication Sig    allopurinoL (ZYLOPRIM) 100 mg Oral Tablet Every one hour    amLODIPine (NORVASC) 5 mg Oral Tablet Take 1 Tablet (5 mg total) by mouth    aspirin (ECOTRIN) 81 mg Oral Tablet, Delayed Release (E.C.) Take 1 Tablet (81 mg total) by mouth    calcium carbonate-vitamin D3 500 mg-5 mcg (200 unit) Oral Tablet Take 1 Tablet by mouth Once a day    carvediloL (COREG) 6.25 mg Oral Tablet Take 1 Tablet (6.25 mg total) by mouth    cyanocobalamin (VITAMIN B-12) 500 mcg Oral Tablet Take 1 Tablet (500 mcg total) by mouth    dapagliflozin  propanediol (FARXIGA) 10 mg Oral Tablet Take 1 Tablet (10 mg total) by mouth Once a day    ergocalciferol, vitamin D2, (DRISDOL) 1,250 mcg (50,000 unit) Oral Capsule Vitamin D2    ferrous sulfate (FERATAB) 324 mg (65 mg iron) Oral Tablet, Delayed Release (E.C.) Take 1 Tablet (324 mg total) by mouth    folic acid (FOLVITE) 1 mg Oral Tablet Take 1 Tablet (1 mg total) by mouth Once a day    furosemide (LASIX) 1 mg/mL Intravenous Injectable furosemide    gabapentin (NEURONTIN) 100 mg Oral Capsule Take 1 Capsule (100 mg total) by mouth    hydrALAZINE (APRESOLINE) 100 mg Oral Tablet Take 1 Tablet (100 mg total) by mouth    isosorbide mononitrate (IMDUR) 30 mg Oral Tablet Sustained Release 24 hr Take 1 Tablet (30 mg total) by mouth Every morning    linaGLIPtin (TRADJENTA) 5 mg Oral Tablet Take 1 Tablet (5 mg total) by mouth Once a day    MetFORMIN (GLUCOPHAGE) 1,000 mg Oral Tablet Take 1 tablet twice a day by oral route.    olmesartan (BENICAR) 40 mg Oral Tablet olmesartan 40 mg  tablet    omega 3-dha-epa-fish oil 183.3 mg-75 mg -91.6 mg-306 mg Oral Capsule omega 3 183.3 mg-dha 75 mg-epa 91.6 mg-fish oil 306 mg capsule   Take by oral route.    rosuvastatin (CRESTOR) 10 mg Oral Tablet rosuvastatin 10 mg tablet   TAKE 1 TABLET BY MOUTH EVERY DAY AT BEDTIME    Sildenafil (VIAGRA) 100 mg Oral Tablet Take 1 Tablet (100 mg total) by mouth Every 24 hours as needed    traZODone (DESYREL) 100 mg Oral Tablet trazodone 100 mg tablet   Take 1 tablet by oral route at bedtime.    Vitamin B12-Folic Acid [id number] mg Oral Tablet Vitamin B-12     Assessment/Plan  Problem List Items Addressed This Visit    None      Erectile dysfunction:   I discussed the differential diagnosis, pathophysiology and nature of erectile dysfunction, including contributing risk factors in patient's case  I counseled patient on lifestyle modifications including regular cardiovascular exercise, tight glycemic control, maintenance of a healthy weight, moderate alcohol  intake and smoking cessation, if applicable  Each of the various following erectile dysfunction treatment options, including risks, benefits and instructions on administration were provided to patient  Phosphodiesterase-5 inhibitor oral therapy (Sildenafil, Vardenafil, Tadalafil) - discussed potential risks of nasal congestion, headache, vision impairment including blue-green discoloration, and/or undesired therapeutic benefit  Intracavernous injection therapy (Caverject, Edex) & intraurethral suppository (MUSE) - discussed potential risks of priapism, penile scarring with subsequent curvature, dysuria, bleeding, pain and/or undesired therapeutic benefit  Vacuum erection device - discussed potential risks of bruising, skin breakdown, penile pain, anejaculation and temporary penile numbness  Penile prosthesis surgery - discussed risks of pain, urethral injury, prosthetic infection requiring explantation, device malfunction, and  patient/partner disatisfaction   Patient concurrently taking Imdur, discontinue Viagra.       BPH/LUTS with PVR  119  I discussed the differential diagnosis, pathophysiology and nature of patient's benign prostate enlargement causing lower urinary tract symptoms  I counseled patient on further conservative management options including appropriate fluid management, avoidance of diuretics including caffeine and alcohol  Additionally, we discussed the role of pharmacotherapy, including risks, benefits and alternatives:  Alpha-blocker therapy [e.g. Tamsulosin] - potential risks of dizziness, asthenia, orthostasis, and retrograde ejaculation  5-Alpha Reductase Inhibitors [e.g. Finasteride] - potential risks of painful breast enlargement, diminished libido or erectile dysfunction, ejaculatory dysfunction, and potential concerns for increased risk of high grade prostate cancer and delayed time improved symptoms  Prescription provided for Tamsulosin Hydrochloride (FlomaxT) 0.4 mg P.O. daily:    I  have discussed in great detail with the patient the treatment of prostate enlargement/lower urinary tract symptoms using tamsulosin.  I have explained my rationale for using tamsulosin as well as potential risks of asthenia (2%), dizziness (5%), rhinitis (3%) and abnormal ejaculation (11%). I encouraged patient to report any prior or current history of alpha-1 antagonist use to their opthalmic surgeon before undergoing any eye surgery due to the risk of intraoperative floppy iris syndrome.  The patient expressed an understanding of the treatment, possible reactions, and possible prognosis.         Prostate cancer screening  Based on patient's clinical findings including his recent prostate specific antigen level, age, ethnicity and relevant risk factors and in accordance with the American Urological Association ([place] (NCCN) published screening guidelines, I would recommend the following:  Continued annual prostate specific antigen & digital rectal exam screening with cessation of screening based on estimated life expectancy of  less than 5 years or upon developing more serious health issues.  PSA in 6 months           Jared Gandy, DO     A combined total of 45 minutes were spent preparing to see the patient, reviewing previous records, ordering tests/medications/procedures, documenting the clinical encounter as well as performing a medically appropriate evaluation and independently interpreting results and communicating them to the patient/family/caregiver as specifically outlined above in the impression and plan.    This note may have been fully or partially generated using MModal Fluency Direct system, and there may be some incorrect words, spellings, and punctuation that were not identified in checking the note before saving.

## 2022-11-28 ENCOUNTER — Other Ambulatory Visit: Payer: MEDICARE | Attending: Nephrology | Admitting: Nephrology

## 2022-11-28 ENCOUNTER — Ambulatory Visit (INDEPENDENT_AMBULATORY_CARE_PROVIDER_SITE_OTHER): Payer: MEDICARE

## 2022-11-28 ENCOUNTER — Other Ambulatory Visit: Payer: Self-pay

## 2022-11-28 DIAGNOSIS — N1832 Chronic kidney disease, stage 3b (CMS HCC): Secondary | ICD-10-CM

## 2022-11-28 DIAGNOSIS — E559 Vitamin D deficiency, unspecified: Secondary | ICD-10-CM | POA: Insufficient documentation

## 2022-11-28 DIAGNOSIS — D631 Anemia in chronic kidney disease: Secondary | ICD-10-CM

## 2022-11-28 DIAGNOSIS — E538 Deficiency of other specified B group vitamins: Secondary | ICD-10-CM

## 2022-11-28 LAB — COMPREHENSIVE METABOLIC PANEL, NON-FASTING
ALBUMIN/GLOBULIN RATIO: 1.4 (ref 0.8–1.4)
ALBUMIN: 4.2 g/dL (ref 3.5–5.7)
ALKALINE PHOSPHATASE: 53 U/L (ref 34–104)
ALT (SGPT): 14 U/L (ref 7–52)
ANION GAP: 6 mmol/L (ref 4–13)
AST (SGOT): 19 U/L (ref 13–39)
BILIRUBIN TOTAL: 0.5 mg/dL (ref 0.3–1.2)
BUN/CREA RATIO: 17 (ref 6–22)
BUN: 36 mg/dL — ABNORMAL HIGH (ref 7–25)
CALCIUM, CORRECTED: 9.2 mg/dL (ref 8.9–10.8)
CALCIUM: 9.4 mg/dL (ref 8.6–10.3)
CHLORIDE: 106 mmol/L (ref 98–107)
CO2 TOTAL: 24 mmol/L (ref 21–31)
CREATININE: 2.13 mg/dL — ABNORMAL HIGH (ref 0.60–1.30)
ESTIMATED GFR: 32 mL/min/{1.73_m2} — ABNORMAL LOW (ref >59)
GLOBULIN: 3 (ref 2.9–5.4)
GLUCOSE: 139 mg/dL — ABNORMAL HIGH (ref 74–109)
OSMOLALITY, CALCULATED: 283 mOsm/kg (ref 270–290)
POTASSIUM: 4.5 mmol/L (ref 3.5–5.1)
PROTEIN TOTAL: 7.2 g/dL (ref 6.4–8.9)
SODIUM: 136 mmol/L (ref 136–145)

## 2022-11-28 LAB — IRON TRANSFERRIN AND TIBC
IRON (TRANSFERRIN) SATURATION: 18 % — ABNORMAL LOW (ref 20–50)
IRON: 58 ug/dL (ref 50–212)
TOTAL IRON BINDING CAPACITY: 319 ug/dL (ref 250–450)
TRANSFERRIN: 228 mg/dL (ref 203–362)
UIBC: 261 ug/dL (ref 130–375)

## 2022-11-28 LAB — CBC WITH DIFF
BASOPHIL #: 0 10*3/uL (ref 0.00–0.10)
BASOPHIL %: 1 % (ref 0–1)
EOSINOPHIL #: 0.2 10*3/uL (ref 0.00–0.50)
EOSINOPHIL %: 4 %
HCT: 39.6 % (ref 36.7–47.1)
HGB: 13.1 g/dL (ref 12.5–16.3)
LYMPHOCYTE #: 1 10*3/uL (ref 1.00–3.00)
LYMPHOCYTE %: 22 % (ref 16–44)
MCH: 28.9 pg (ref 23.8–33.4)
MCHC: 33 g/dL (ref 32.5–36.3)
MCV: 87.4 fL (ref 73.0–96.2)
MONOCYTE #: 0.4 10*3/uL (ref 0.30–1.00)
MONOCYTE %: 9 % (ref 5–13)
MPV: 10 fL (ref 7.4–11.4)
NEUTROPHIL #: 2.9 10*3/uL (ref 1.85–7.80)
NEUTROPHIL %: 65 % (ref 43–77)
PLATELETS: 117 10*3/uL — ABNORMAL LOW (ref 140–440)
RBC: 4.53 10*6/uL (ref 4.06–5.63)
RDW: 15.4 % (ref 12.1–16.2)
WBC: 4.5 10*3/uL (ref 3.6–10.2)

## 2022-11-28 LAB — PHOSPHORUS: PHOSPHORUS: 3.6 mg/dL — ABNORMAL LOW (ref 3.7–7.2)

## 2022-11-28 LAB — VITAMIN D 25 TOTAL: VITAMIN D: 38 ng/mL (ref 30–100)

## 2022-11-28 LAB — PARATHYROID HORMONE (PTH): PTH: 70.6 pg/mL (ref 12.0–88.0)

## 2022-11-28 LAB — MAGNESIUM: MAGNESIUM: 2.1 mg/dL (ref 1.9–2.7)

## 2022-11-28 LAB — FERRITIN: FERRITIN: 69 ng/mL (ref 11–336)

## 2022-11-28 LAB — VITAMIN B12: VITAMIN B 12: 693 pg/mL (ref 180–914)

## 2022-11-28 LAB — FOLATE: FOLATE: >23.4 ng/mL (ref 5.9–24.4)

## 2022-11-28 LAB — MICROALBUMIN URINE, RANDOM: MICROALBUMIN RANDOM URINE: <0.7 mg/dL

## 2022-11-28 LAB — URIC ACID: URIC ACID: 5.2 mg/dL (ref 2.3–7.6)

## 2022-12-05 ENCOUNTER — Encounter (INDEPENDENT_AMBULATORY_CARE_PROVIDER_SITE_OTHER): Payer: Self-pay | Admitting: Nephrology

## 2022-12-05 ENCOUNTER — Ambulatory Visit (INDEPENDENT_AMBULATORY_CARE_PROVIDER_SITE_OTHER): Payer: MEDICARE | Admitting: Nephrology

## 2022-12-05 ENCOUNTER — Other Ambulatory Visit: Payer: Self-pay

## 2022-12-05 VITALS — BP 110/66 | HR 58 | Ht 74.0 in | Wt 279.0 lb

## 2022-12-05 DIAGNOSIS — N1832 Chronic kidney disease, stage 3b (CMS HCC): Secondary | ICD-10-CM

## 2022-12-05 DIAGNOSIS — I509 Heart failure, unspecified: Secondary | ICD-10-CM

## 2022-12-05 DIAGNOSIS — I10 Essential (primary) hypertension: Secondary | ICD-10-CM

## 2022-12-05 DIAGNOSIS — Z7984 Long term (current) use of oral hypoglycemic drugs: Secondary | ICD-10-CM

## 2022-12-05 DIAGNOSIS — E559 Vitamin D deficiency, unspecified: Secondary | ICD-10-CM

## 2022-12-05 DIAGNOSIS — E1122 Type 2 diabetes mellitus with diabetic chronic kidney disease: Secondary | ICD-10-CM

## 2022-12-05 DIAGNOSIS — I130 Hypertensive heart and chronic kidney disease with heart failure and stage 1 through stage 4 chronic kidney disease, or unspecified chronic kidney disease: Secondary | ICD-10-CM

## 2022-12-05 DIAGNOSIS — E538 Deficiency of other specified B group vitamins: Secondary | ICD-10-CM

## 2022-12-05 DIAGNOSIS — D631 Anemia in chronic kidney disease: Secondary | ICD-10-CM

## 2022-12-05 MED ORDER — OLMESARTAN 40 MG TABLET
40.0000 mg | ORAL_TABLET | Freq: Every day | ORAL | 0 refills | Status: DC
Start: 2022-12-05 — End: 2023-10-07

## 2022-12-05 MED ORDER — FERROUS SULFATE 324 MG (65 MG IRON) TABLET,DELAYED RELEASE
324.0000 mg | DELAYED_RELEASE_TABLET | Freq: Every day | ORAL | 3 refills | Status: DC
Start: 2022-12-05 — End: 2023-02-20

## 2022-12-05 MED ORDER — DAPAGLIFLOZIN PROPANEDIOL 10 MG TABLET
10.0000 mg | ORAL_TABLET | Freq: Every day | ORAL | 3 refills | Status: AC
Start: 2022-12-05 — End: 2023-03-05

## 2022-12-05 NOTE — Progress Notes (Signed)
NEPHROLOGY, [place]  [address]  [place] [address]       Name: Jared Mcintyre MRN:  [id number]   Date of Birth: 1951/05/28 Age: 72 y.o.   Date: 12/05/2022  Time: 13:08       Nephrology Office Note    Reason for visit: Follow Up (F/u  labs)      History of Present Illness:  Jared Mcintyre  is a 72 y.o. white  gentleman with past medical history of diabetes and hypertension on multiple blood pressure medications presenting to the clinic for follow-up.  Patient denies nausea or vomiting.  No chest pain or shortness of breath.  Patient taking currently the following medication Benicar Coreg Lasix hydralazine, he is not taking Aldactone or amlodipine.  Patient takes also clonidine 0.1 mg at bedtime.  Patient denies nausea or vomiting no chest pain or shortness of breath denies diarrhea.  He reports improvement in the edema.  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 function is better creatinine 1.7 GFR 52 mils per minute anemia of chronic kidney disease hemoglobin 11.9 hemoglobin A1c 5.9 kidney ultrasound showed small bilateral cysts benign anemia profile showed very low iron needs iron supplements twice a day drink plenty of fluids keep sugar controlled keep blood pressure control repeat lab work see him back in 6 months  04-17-2022  Nephrology clinic visit for this patient chronic kidney disease stage 3 kidney function is worse creatinine 2.2 GFR 31 mL/minute blood pressure on the low side will adjust medications drink plenty of fluids avoid NSAIDs keep blood pressure controlled keep sugar controlled repeat lab work see him back in 6 months anemia of chronic kidney disease hemoglobin 11.7 iron saturation is low 16 percent continue iron supplements twice a day vitamin [id number] folic acid within normal vitamin-D within normal drink plenty of fluids repeat lab work see him back in 6 months  Patient has no edema in his legs and  his lungs are clear will ask him to cut down Lasix 20 milligram once a day instead of 40 milligram once a day and discuss this with his cardiologist because of worsening kidney function  09-18-2022  Nephrology Clinic follow-up visit on this patient diabetic hypertensive CKD stage IIIB hemoglobin at 12.4 creatinine 2.2 GFR 31 mL/minute stable kidney function anemia chronic kidney disease anemia profile showed iron 12% needs more iron supplements folic acid vitamin 123456 within normal vitamin-D within normal drink plenty of fluids avoid NSAIDs keep sugar blood pressure control repeat lab work see him back in 6 months.  12-05-2022  Nephrology Clinic follow-up visit on this patient diabetic hypertensive CKD stage IIIB last lab work showed hemoglobin 13.1 hemoglobin is better than previous reading so anemia is numbers improving creatinine 2.1 GFR 32 mL/minute kidney function staying the same still stage IIIB CKD PTH 70 within normal anemia profile iron saturation still iron 18% continue iron supplements folic acid vitamin 123456 vitamin-D all within normal stable kidney function drink plenty of fluids avoid NSAIDs continue iron supplements repeat lab work keep sugar blood pressure control repeat lab work see him back in 6 months      Past Medical History:  Past Medical History:   Diagnosis Date    Congestive heart failure (CMS HCC)     Diabetes mellitus, type 2 (CMS HCC)     Essential hypertension          Past Surgical History:  Past Surgical History:   Procedure Laterality Date    HX BACK SURGERY      HX COLONOSCOPY      HX ELBOW SURGERY Right     Release of Tendon    HX HAND SURGERY           Allergies:  Allergies   Allergen Reactions    Indomethacin  Other Adverse Reaction (Add comment) and Swelling     Medications:  Current Outpatient Medications   Medication Sig    allopurinoL (ZYLOPRIM) 100 mg Oral Tablet Every one hour    amLODIPine (NORVASC) 5 mg Oral Tablet Take 1 Tablet (5 mg total) by mouth    aspirin (ECOTRIN) 81  mg Oral Tablet, Delayed Release (E.C.) Take 1 Tablet (81 mg total) by mouth    calcium carbonate-vitamin D3 500 mg-5 mcg (200 unit) Oral Tablet Take 1 Tablet by mouth Once a day    carvediloL (COREG) 6.25 mg Oral Tablet Take 1 Tablet (6.25 mg total) by mouth    cyanocobalamin (VITAMIN B-12) 500 mcg Oral Tablet Take 1 Tablet (500 mcg total) by mouth    dapagliflozin propanediol (FARXIGA) 10 mg Oral Tablet Take 1 Tablet (10 mg total) by mouth Once a day    ergocalciferol, vitamin D2, (DRISDOL) 1,250 mcg (50,000 unit) Oral Capsule Vitamin D2    ferrous sulfate (FERATAB) 324 mg (65 mg iron) Oral Tablet, Delayed Release (E.C.) Take 1 Tablet (324 mg total) by mouth Once a day for 90 days    folic acid (FOLVITE) 1 mg Oral Tablet Take 1 Tablet (1 mg total) by mouth Once a day    furosemide (LASIX) 1 mg/mL Intravenous Injectable furosemide    gabapentin (NEURONTIN) 100 mg Oral Capsule Take 1 Capsule (100 mg total) by mouth    hydrALAZINE (APRESOLINE) 100 mg Oral Tablet Take 1 Tablet (100 mg total) by mouth    isosorbide mononitrate (IMDUR) 30 mg Oral Tablet Sustained Release 24 hr Take 1 Tablet (30 mg total) by mouth Every morning    linaGLIPtin (TRADJENTA) 5 mg Oral Tablet Take 1 Tablet (5 mg total) by mouth Once a day    MetFORMIN (GLUCOPHAGE) 1,000 mg Oral Tablet Take 1 tablet twice a day by oral route.    olmesartan (BENICAR) 40 mg Oral Tablet olmesartan 40 mg tablet    omega 3-dha-epa-fish oil 183.3 mg-75 mg -91.6 mg-306 mg Oral Capsule omega 3 183.3 mg-dha 75 mg-epa 91.6 mg-fish oil 306 mg capsule   Take by oral route.    rosuvastatin (CRESTOR) 10 mg Oral Tablet rosuvastatin 10 mg tablet   TAKE 1 TABLET BY MOUTH EVERY DAY AT BEDTIME    tamsulosin (FLOMAX) 0.4 mg Oral Capsule Take 1 Capsule (0.4 mg total) by mouth Every evening after dinner    traZODone (DESYREL) 100 mg Oral Tablet trazodone 100 mg tablet   Take 1 tablet by oral route at bedtime.    Vitamin B12-Folic Acid [id number] mg Oral Tablet Vitamin B-12     Family  History:  Family Medical History:       Problem Relation (Age of Onset)    COPD Other    Cancer Father    Diabetes Mother    Diabetes type II Other    Elevated Lipids Other    Hypertension (High Blood Pressure) Brother, Other    Stroke Other    Transient ischemic attack Other            Social History:  Social History  Socioeconomic History    Marital status: Married   Tobacco Use    Smoking status: Former     Current packs/day: 0.00     Types: Cigarettes     Quit date: 2003     Years since quitting: 21.2    Smokeless tobacco: Never   Substance and Sexual Activity    Alcohol use: Never    Drug use: Never       Review of Systems:  Constitutional: negative for fevers, chills, sweats, and fatigue  Eyes: negative for visual disturbance, irritation, redness, and icterus  Ears, nose, mouth, throat, and face: negative for hearing loss, ear drainage, nasal congestion, epistaxis  Respiratory: negative for cough, sputum, hemoptysis, wheezing, or dyspnea on exertion  Cardiovascular: negative for chest pain, palpitations, syncope, orthopnea, paroxysmal nocturnal dyspnea, and   Gastrointestinal: negative for dysphagia, nausea, vomiting, melena, diarrhea, constipation, and abdominal pain  Genitourinary:negative for frequency, dysuria, nocturia, urinary incontinence, hesitancy, and hematuria  Integument/breast: negative for rash, skin lesion(s), and pruritus  Hematologic/lymphatic: negative for easy bruising, bleeding, and petechiae  Musculoskeletal:negative for myalgias, arthralgias, neck pain, back pain, no lower extremity edema, and muscle weakness  Neurological: negative for headaches, dizziness, seizures, speech problems, tremor, and weakness  Behavioral/Psych: negative for anxiety, behavior problems, mood swings, and sleep disturbance  Endocrine: negative for temperature intolerance  Allergic/Immunologic: negative for urticaria and angioedema    Physical Exam:  Vitals:    12/05/22 1304   BP: 110/66   Pulse: 58   Weight:  127 kg (279 lb)   Height: 1.88 m ('6\' 2"'$ )   BMI: 35.9      Constitutional: Alert and Oriented, no distress,   HEENT : normocephalic , atraumatic vision and hearing grossly normal   Eyes: Conjunctiva clear., Pupils equal and round, reactive to light and accomodation. , Sclera non-icteric.   ENT: Nose without erythema. , Mouth mucous membranes moist.   Neck: no thyromegaly or lymphadenopathy and supple, symmetrical, trachea midline  Respiratory: Clear to auscultation bilaterally. No wheezes, No rales, Good air exchange bilaterally  Cardiovascular: regular rate and rhythm no murmurs no rub  Gastrointestinal: Soft, non-tender, Bowel sounds normal, No hepatosplenomegaly  Genitourinary: no suprapubic tenderness  Lower Extremities: No edema, Pulses +4  Neurologic: Grossly normal. Speech clear.   Psychiatric: Normal mood and affect     Lab:  Lab Results   Component Value Date    BUN 36 (H) 11/28/2022    BUN 29 (H) 10/11/2022    BUN 26 (H) 09/10/2022    CREATININE 2.13 (H) 11/28/2022    CREATININE 1.96 (H) 10/11/2022    CREATININE 2.24 (H) 09/10/2022    BUNCRRATIO 17 11/28/2022    BUNCRRATIO 15 10/11/2022    BUNCRRATIO 12 09/10/2022    GFR 32 (L) 11/28/2022    GFR 36 (L) 10/11/2022    GFR 31 (L) 09/10/2022    SODIUM 136 11/28/2022    SODIUM 136 10/11/2022    SODIUM 139 09/10/2022    POTASSIUM 4.5 11/28/2022    POTASSIUM 4.4 10/11/2022    POTASSIUM 4.4 09/10/2022    CHLORIDE 106 11/28/2022    CHLORIDE 103 10/11/2022    CHLORIDE 108 (H) 09/10/2022    CO2 24 11/28/2022    CO2 25 10/11/2022    CO2 24 09/10/2022    ANIONGAP 6 11/28/2022    ANIONGAP 8 10/11/2022    ANIONGAP 7 09/10/2022    CALCIUM 9.4 11/28/2022    CALCIUM 9.5 10/11/2022    CALCIUM 9.1 09/10/2022  PHOSPHORUS 3.6 (L) 11/28/2022    PHOSPHORUS 3.7 10/11/2022    PHOSPHORUS 3.0 (L) 09/10/2022    ALBUMIN 4.2 11/28/2022    ALBUMIN 4.1 10/11/2022    ALBUMIN 4.1 09/10/2022    HGB 13.1 11/28/2022    HGB 13.9 10/11/2022    HGB 12.4 (L) 09/10/2022    HCT 39.6 11/28/2022     HCT 42.2 10/11/2022    HCT 37.4 09/10/2022    INTACTPTH 70.6 11/28/2022    INTACTPTH 62.8 10/11/2022    INTACTPTH 87.4 09/10/2022    IRON 58 11/28/2022    IRON 60 10/11/2022    IRON 35 (L) 09/10/2022    IRONBINDCAP 319 11/28/2022    IRONBINDCAP 290 10/11/2022    IRONBINDCAP 301 09/10/2022    IRONSAT 18 (L) 11/28/2022    IRONSAT 21 10/11/2022    IRONSAT 12 (L) 09/10/2022    FERRITIN 69 11/28/2022    FERRITIN 95 04/10/2022       Lab Results   Component Value Date    HA1C 6.5 (H) 10/11/2022    HA1C 6.5 (H) 09/10/2022    HA1C 6.8 (H) 04/10/2022    URICACID 5.2 11/28/2022    URICACID 5.0 10/11/2022    URICACID 5.7 09/10/2022        Assessment and Plan:  ENCOUNTER DIAGNOSES     ICD-10-CM   1. Stage 3b chronic kidney disease (CMS HCC)  N18.32   2. Anemia in stage 3b chronic kidney disease (CMS HCC)   N18.32    D63.1   3. Vitamin D deficiency  E55.9   4. Vitamin B12 deficiency  E53.8   5. Essential (primary) hypertension  I10   6. Congestive heart failure, unspecified HF chronicity, unspecified heart failure type (CMS HCC)  I50.9   7. Type 2 diabetes mellitus with stage 3b chronic kidney disease, without long-term current use of insulin (CMS HCC)  E11.22    N18.32        Plan:  Stage IIIB CKD stable kidney function drink plenty of fluids avoid NSAIDs keep sugar blood pressure control repeat lab work see him back in 6 months  Anemia of chronic kidney disease continue iron supplements hemoglobin improving  Vitamin-D deficiency and vitamin B12 deficiency continue supplements levels within normal  Essential hypertension keep blood pressure controlled continue olmesartan  CHF management by Cardiology  Type 2 diabetes keep sugar controlled continue Farxiga                   No follow-ups on file.    Richardean Sale, MD     Portions of this note may be dictated using voice recognition software or a dictation service. Variances in spelling and vocabulary are possible and unintentional. Not all errors are caught/corrected. Please  notify the [facility] if any discrepancies are noted or if the meaning of any statement is not clear. ,b

## 2023-01-08 ENCOUNTER — Ambulatory Visit (INDEPENDENT_AMBULATORY_CARE_PROVIDER_SITE_OTHER): Payer: Self-pay

## 2023-02-20 ENCOUNTER — Other Ambulatory Visit (INDEPENDENT_AMBULATORY_CARE_PROVIDER_SITE_OTHER): Payer: Self-pay | Admitting: Nephrology

## 2023-02-20 MED ORDER — FERROUS SULFATE 324 MG (65 MG IRON) TABLET,DELAYED RELEASE
324.0000 mg | DELAYED_RELEASE_TABLET | Freq: Every day | ORAL | 3 refills | Status: AC
Start: 2023-02-20 — End: 2023-05-21

## 2023-02-20 NOTE — Telephone Encounter (Signed)
Ferrous Sulfate 324 mg sent to [facility] on 02/20/23 at 3:11 pm per Dr. Cecilie Lowers

## 2023-03-27 ENCOUNTER — Telehealth (INDEPENDENT_AMBULATORY_CARE_PROVIDER_SITE_OTHER): Payer: Self-pay | Admitting: NURSE PRACTITIONER

## 2023-03-27 NOTE — Telephone Encounter (Signed)
Called patient and reschedule appt from 7-24 3:30 to 8-16 1030 for 1100

## 2023-04-17 ENCOUNTER — Ambulatory Visit (INDEPENDENT_AMBULATORY_CARE_PROVIDER_SITE_OTHER): Payer: Self-pay | Admitting: NURSE PRACTITIONER

## 2023-05-09 ENCOUNTER — Ambulatory Visit (INDEPENDENT_AMBULATORY_CARE_PROVIDER_SITE_OTHER): Payer: Self-pay | Admitting: NURSE PRACTITIONER

## 2023-05-09 ENCOUNTER — Encounter (INDEPENDENT_AMBULATORY_CARE_PROVIDER_SITE_OTHER): Payer: Self-pay | Admitting: Student in an Organized Health Care Education/Training Program

## 2023-05-09 NOTE — Cancer Center Note (Signed)
Department of Hematology/Oncology  Return patient visit    Name: Jared Mcintyre  ZOX:[id number]  Date of Birth: 1951/04/19  Encounter Date: 05/10/2023    REFERRING PROVIDER:  No referring provider defined for this encounter.    REASON FOR OFFICE VISIT:   Follow up on ITP.    HISTORY OF PRESENT ILLNESS:  Jared Mcintyre is a 72 y.o. 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  Patient reports that he is doing fair.  He will be having a stress test for Dr. Renda Rolls and has stopped Coreg due to his HR going too low.  The patient has had a good appetite and stable weight.  The patient has not noted any new areas of disease on own self exam.  The patient has not had any chest pain or dyspnea.  The patient has not had any headaches or changes in vision.  The patient has not had any abdominal pain, nausea, or vomiting.  There have been no changes in bowel or bladder habits.  The patient has not had any abnormal bleeding or clotting episodes.  There have been no complaints of fever, chills, cough, sputum production, dysuria, or diarrhea.          ROS:   Review of Systems   Constitutional:  Positive for chills. Negative for fatigue and fever.        Pain in left side of scalp that will last for a minute of more.  It will then get sore after this.     HENT:  Negative.     Eyes: Negative.    Respiratory:  Negative for shortness of breath.    Cardiovascular:  Positive for chest pain (occasionally see Dr. Renda Rolls for this.).   Gastrointestinal:  Negative for abdominal pain, diarrhea, nausea and vomiting.   Endocrine: Negative.    Genitourinary:  Negative for difficulty urinating and hematuria.    Musculoskeletal:  Positive for back pain (mid lower back from previous injury). Negative for arthralgias.   Neurological:  Positive for dizziness (often). Negative for headaches.   Hematological:  Negative for adenopathy. Does not bruise/bleed easily.   Psychiatric/Behavioral: Negative.          History:  Past Medical History:   Diagnosis Date     Congestive heart failure (CMS HCC)     Diabetes mellitus, type 2 (CMS HCC)     Essential hypertension            Past Surgical History:   Procedure Laterality Date    HX BACK SURGERY      HX COLONOSCOPY      HX ELBOW SURGERY Right     Release of Tendon    HX HAND SURGERY             Social History     Socioeconomic History    Marital status: Married     Spouse name: Not on file    Number of children: Not on file    Years of education: Not on file    Highest education level: Not on file   Occupational History    Not on file   Tobacco Use    Smoking status: Former     Current packs/day: 0.00     Types: Cigarettes     Quit date: 2003     Years since quitting: 21.6    Smokeless tobacco: Never   Substance and Sexual Activity    Alcohol use: Never    Drug use: Never  Sexual activity: Not on file   Other Topics Concern    Not on file   Social History Narrative    Not on file     Social Determinants of Health     Financial Resource Strain: Not on file   Transportation Needs: Not on file   Social Connections: Not on file   Intimate Partner Violence: Not on file   Housing Stability: Not on file       Social History     Social History Narrative    Not on file       Social History     Substance and Sexual Activity   Drug Use Never       Family Medical History:       Problem Relation (Age of Onset)    COPD Other    Cancer Father    Diabetes Mother    Diabetes type II Other    Elevated Lipids Other    Hypertension (High Blood Pressure) Brother, Other    Stroke Other    Transient ischemic attack Other              Current Outpatient Medications   Medication Sig    allopurinoL (ZYLOPRIM) 100 mg Oral Tablet Every one hour    aspirin (ECOTRIN) 81 mg Oral Tablet, Delayed Release (E.C.) Take 1 Tablet (81 mg total) by mouth    calcium carbonate-vitamin D3 500 mg-5 mcg (200 unit) Oral Tablet Take 1 Tablet by mouth Once a day    cholecalciferol, vitamin D3, 25 mcg (1,000 unit) Oral Tablet 1 Tablet (1,000 Units total)     cyanocobalamin (VITAMIN B-12) 500 mcg Oral Tablet Take 1 Tablet (500 mcg total) by mouth    ferrous sulfate (FERATAB) 324 mg (65 mg iron) Oral Tablet, Delayed Release (E.C.) Take 1 Tablet (324 mg total) by mouth Once a day for 90 days    folic acid (FOLVITE) 1 mg Oral Tablet Take 1 Tablet (1 mg total) by mouth Once a day    furosemide (LASIX) 1 mg/mL Intravenous Injectable furosemide    gabapentin (NEURONTIN) 100 mg Oral Capsule Take 1 Capsule (100 mg total) by mouth    hydrALAZINE (APRESOLINE) 100 mg Oral Tablet Take 1 Tablet (100 mg total) by mouth    isosorbide mononitrate (IMDUR) 30 mg Oral Tablet Sustained Release 24 hr Take 1 Tablet (30 mg total) by mouth Every morning    linaGLIPtin (TRADJENTA) 5 mg Oral Tablet Take 1 Tablet (5 mg total) by mouth Once a day    olmesartan (BENICAR) 40 mg Oral Tablet Take 1 Tablet (40 mg total) by mouth Once a day for 90 days    omega 3-dha-epa-fish oil 183.3 mg-75 mg -91.6 mg-306 mg Oral Capsule omega 3 183.3 mg-dha 75 mg-epa 91.6 mg-fish oil 306 mg capsule   Take by oral route.    rosuvastatin (CRESTOR) 10 mg Oral Tablet rosuvastatin 10 mg tablet   TAKE 1 TABLET BY MOUTH EVERY DAY AT BEDTIME    tamsulosin (FLOMAX) 0.4 mg Oral Capsule Take 1 Capsule (0.4 mg total) by mouth Every evening after dinner    traZODone (DESYREL) 100 mg Oral Tablet trazodone 100 mg tablet   Take 1 tablet by oral route at bedtime.    Vitamin B12-Folic Acid 0.5-1 mg Oral Tablet Vitamin B-12       Allergies   Allergen Reactions    Indomethacin  Other Adverse Reaction (Add comment) and Swelling         PHYSICAL  EXAM:  BP 106/67 (Site: Left Arm, Patient Position: Sitting, Cuff Size: Adult)   Pulse 56   Temp 36.2 C (97.1 F) (Temporal)   Ht 1.88 m (6\' 2" )   Wt 128 kg (282 lb)   BMI 36.21 kg/m        ECOG Status: (0) Fully active, able to carry on all predisease performance without restriction    Physical Exam  Vitals reviewed.   Constitutional:       Appearance: Normal appearance. He is normal  weight.   Cardiovascular:      Rate and Rhythm: Normal rate and regular rhythm.      Pulses: Normal pulses.      Heart sounds: Normal heart sounds, S1 normal and S2 normal. No murmur heard.     No S3 or S4 sounds.   Pulmonary:      Effort: Pulmonary effort is normal.      Breath sounds: Normal breath sounds.   Abdominal:      General: Bowel sounds are normal.      Palpations: Abdomen is soft.   Lymphadenopathy:      Head:      Right side of head: No submental, submandibular, tonsillar, preauricular, posterior auricular or occipital adenopathy.      Left side of head: No submental, submandibular, tonsillar, preauricular, posterior auricular or occipital adenopathy.      Cervical: No cervical adenopathy.      Right cervical: No superficial, deep or posterior cervical adenopathy.     Left cervical: No superficial, deep or posterior cervical adenopathy.   Skin:     General: Skin is warm and dry.      Capillary Refill: Capillary refill takes less than 2 seconds.   Neurological:      Mental Status: He is alert and oriented to person, place, and time. Mental status is at baseline.          LABS:   CBC  Diff   Lab Results   Component Value Date/Time    WBC 6.0 05/10/2023 11:05 AM    HGB 13.2 05/10/2023 11:05 AM    HCT 39.6 05/10/2023 11:05 AM    PLTCNT 117 (L) 05/10/2023 11:05 AM    RBC 4.50 05/10/2023 11:05 AM    MCV 88.1 05/10/2023 11:05 AM    MCHC 33.4 05/10/2023 11:05 AM    MCH 29.4 05/10/2023 11:05 AM    RDW 15.6 05/10/2023 11:05 AM    MPV 10.3 05/10/2023 11:05 AM    Lab Results   Component Value Date/Time    PMNS 65 11/28/2022 11:00 AM    LYMPHOCYTES 22 11/28/2022 11:00 AM    EOSINOPHIL 4 11/28/2022 11:00 AM    MONOCYTES 9 11/28/2022 11:00 AM    BASOPHILS 1 11/28/2022 11:00 AM    BASOPHILS 0.00 11/28/2022 11:00 AM    PMNABS 2.90 11/28/2022 11:00 AM    LYMPHSABS 1.00 11/28/2022 11:00 AM    EOSABS 0.20 11/28/2022 11:00 AM    MONOSABS 0.40 11/28/2022 11:00 AM            Comprehensive Metabolic Profile    Lab Results    Component Value Date    SODIUM 138 05/10/2023    POTASSIUM 4.9 05/10/2023    CHLORIDE 106 05/10/2023    CO2 20 (L) 05/10/2023    ANIONGAP 12 05/10/2023    BUN 34 (H) 05/10/2023    CREATININE 2.37 (H) 05/10/2023    ALBUMIN 4.4 05/10/2023    CALCIUM 9.2 05/10/2023  GLUCOSENF 147 (H) 05/10/2023    ALKPHOS 62 05/10/2023    ALT 15 05/10/2023    AST 21 05/10/2023    TOTBILIRUBIN 0.5 05/10/2023    TOTALPROTEIN 7.4 05/10/2023            IRON   Date Value Ref Range Status   11/28/2022 58 50 - 212 ug/dL Final     FERRITIN   Date Value Ref Range Status   11/28/2022 69 11 - 336 ng/mL Final     TOTAL IRON BINDING CAPACITY   Date Value Ref Range Status   11/28/2022 319 250 - 450 ug/dL Final     UIBC   Date Value Ref Range Status   11/28/2022 261 130 - 375 ug/dL Final     IRON (TRANSFERRIN) SATURATION   Date Value Ref Range Status   11/28/2022 18 (L) 20 - 50 % Final     VITAMIN B 12   Date Value Ref Range Status   11/28/2022 693 180 - 914 pg/mL Final     FOLATE   Date Value Ref Range Status   11/28/2022 >23.4 5.9 - 24.4 ng/mL Final         ASSESSMENT:    ICD-10-CM    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 All relative external and internal medical records were reviewed including available H&Ps, progress notes, procedure notes, imaging's, laboratories, and pathology.   2. Thrombocytopenia:  Patient is stable and asymptomatic at this time.   I will see him in 8 months, sooner if needed.         Jared Mcintyre was given the chance to ask questions, and these were answered to their satisfaction. The patient is welcome to call with any questions or concerns in the meantime.     Return in about 8 months (around 01/08/2024).     Marvene Staff APRN, FNP-BC, AOCNP, 05/10/2023 , 12:12     You can see your note(s) in [email or web address]. It is common for you to encounter certain medical terminology which may be unfamiliar to you. You might see results before your provider does so please  give at least 2 business days for review. Please have this understanding, that NOT all abnormal results are significant. Our office will contact you for any urgent or emergent action if necessary. If you have any questions or concerns, feel free to send a MyChart message or call the office. Please call with any new or concerning symptoms.     CC:  Festus Holts, MD  [address] [facility]  [place] [address]    No referring provider defined for this encounter.      This note was partially generated using MModal Fluency Direct system, and there may be some incorrect words, spellings, and punctuation that were not noted in checking the note before saving.

## 2023-05-10 ENCOUNTER — Inpatient Hospital Stay (INDEPENDENT_AMBULATORY_CARE_PROVIDER_SITE_OTHER)
Admission: RE | Admit: 2023-05-10 | Discharge: 2023-05-10 | Disposition: A | Discharge status: Home / Self Care | Payer: MEDICARE | Source: Ambulatory Visit | Attending: NURSE PRACTITIONER | Admitting: NURSE PRACTITIONER

## 2023-05-10 ENCOUNTER — Encounter (INDEPENDENT_AMBULATORY_CARE_PROVIDER_SITE_OTHER): Payer: Self-pay | Admitting: NURSE PRACTITIONER

## 2023-05-10 ENCOUNTER — Other Ambulatory Visit: Payer: Self-pay

## 2023-05-10 ENCOUNTER — Ambulatory Visit: Payer: MEDICARE | Attending: NURSE PRACTITIONER | Admitting: NURSE PRACTITIONER

## 2023-05-10 VITALS — BP 106/67 | HR 56 | Temp 97.1°F | Ht 74.0 in | Wt 282.0 lb

## 2023-05-10 DIAGNOSIS — Z862 Personal history of diseases of the blood and blood-forming organs and certain disorders involving the immune mechanism: Secondary | ICD-10-CM

## 2023-05-10 DIAGNOSIS — D696 Thrombocytopenia, unspecified: Secondary | ICD-10-CM

## 2023-05-10 DIAGNOSIS — Z87891 Personal history of nicotine dependence: Secondary | ICD-10-CM

## 2023-05-10 DIAGNOSIS — D693 Immune thrombocytopenic purpura: Secondary | ICD-10-CM | POA: Insufficient documentation

## 2023-05-10 LAB — COMPREHENSIVE METABOLIC PANEL, NON-FASTING
ALBUMIN/GLOBULIN RATIO: 1.5 — ABNORMAL HIGH (ref 0.8–1.4)
ALBUMIN: 4.4 g/dL (ref 3.5–5.7)
ALKALINE PHOSPHATASE: 62 U/L (ref 34–104)
ALT (SGPT): 15 U/L (ref 7–52)
ANION GAP: 12 mmol/L (ref 4–13)
AST (SGOT): 21 U/L (ref 13–39)
BILIRUBIN TOTAL: 0.5 mg/dL (ref 0.3–1.0)
BUN/CREA RATIO: 14 (ref 6–22)
BUN: 34 mg/dL — ABNORMAL HIGH (ref 7–25)
CALCIUM, CORRECTED: 8.9 mg/dL (ref 8.9–10.8)
CALCIUM: 9.2 mg/dL (ref 8.6–10.3)
CHLORIDE: 106 mmol/L (ref 98–107)
CO2 TOTAL: 20 mmol/L — ABNORMAL LOW (ref 21–31)
CREATININE: 2.37 mg/dL — ABNORMAL HIGH (ref 0.60–1.30)
ESTIMATED GFR: 28 mL/min/{1.73_m2} — ABNORMAL LOW (ref >59)
GLOBULIN: 3 (ref 2.9–5.4)
GLUCOSE: 147 mg/dL — ABNORMAL HIGH (ref 74–109)
OSMOLALITY, CALCULATED: 286 mOsm/kg (ref 270–290)
POTASSIUM: 4.9 mmol/L (ref 3.5–5.1)
PROTEIN TOTAL: 7.4 g/dL (ref 6.4–8.9)
SODIUM: 138 mmol/L (ref 136–145)

## 2023-05-10 LAB — CBC
HCT: 39.6 % (ref 36.7–47.1)
HGB: 13.2 g/dL (ref 12.5–16.3)
MCH: 29.4 pg (ref 23.8–33.4)
MCHC: 33.4 g/dL (ref 32.5–36.3)
MCV: 88.1 fL (ref 73.0–96.2)
MPV: 10.3 fL (ref 7.4–11.4)
PLATELETS: 117 10*3/uL — ABNORMAL LOW (ref 140–440)
RBC: 4.5 10*6/uL (ref 4.06–5.63)
RDW: 15.6 % (ref 12.1–16.2)
WBC: 6 10*3/uL (ref 3.6–10.2)

## 2023-05-29 ENCOUNTER — Ambulatory Visit (INDEPENDENT_AMBULATORY_CARE_PROVIDER_SITE_OTHER): Payer: MEDICARE

## 2023-05-29 ENCOUNTER — Other Ambulatory Visit: Payer: MEDICARE

## 2023-05-29 ENCOUNTER — Other Ambulatory Visit: Payer: Self-pay

## 2023-05-29 ENCOUNTER — Other Ambulatory Visit (INDEPENDENT_AMBULATORY_CARE_PROVIDER_SITE_OTHER): Payer: Self-pay

## 2023-05-29 DIAGNOSIS — N1832 Chronic kidney disease, stage 3b (CMS HCC): Secondary | ICD-10-CM

## 2023-05-29 DIAGNOSIS — E559 Vitamin D deficiency, unspecified: Secondary | ICD-10-CM

## 2023-05-29 LAB — CBC WITH DIFF
BASOPHIL #: 0 10*3/uL (ref 0.00–0.10)
BASOPHIL %: 1 % (ref 0–1)
EOSINOPHIL #: 0.1 10*3/uL (ref 0.00–0.50)
EOSINOPHIL %: 3 % (ref 1–8)
HCT: 40.6 % (ref 36.7–47.1)
HGB: 13.4 g/dL (ref 12.5–16.3)
LYMPHOCYTE #: 1.3 10*3/uL (ref 1.00–3.00)
LYMPHOCYTE %: 27 % (ref 16–44)
MCH: 29.2 pg (ref 23.8–33.4)
MCHC: 33 g/dL (ref 32.5–36.3)
MCV: 88.5 fL (ref 73.0–96.2)
MONOCYTE #: 0.5 10*3/uL (ref 0.30–1.00)
MONOCYTE %: 10 % (ref 5–13)
MPV: 10.1 fL (ref 7.4–11.4)
NEUTROPHIL #: 2.9 10*3/uL (ref 1.85–7.80)
NEUTROPHIL %: 60 % (ref 43–77)
PLATELETS: 104 10*3/uL — ABNORMAL LOW (ref 140–440)
RBC: 4.59 10*6/uL (ref 4.06–5.63)
RDW: 15.3 % (ref 12.1–16.2)
WBC: 4.8 10*3/uL (ref 3.6–10.2)

## 2023-05-29 LAB — BASIC METABOLIC PANEL
ANION GAP: 6 mmol/L (ref 4–13)
BUN/CREA RATIO: 16 (ref 6–22)
BUN: 35 mg/dL — ABNORMAL HIGH (ref 7–25)
CALCIUM: 9.4 mg/dL (ref 8.6–10.3)
CHLORIDE: 108 mmol/L — ABNORMAL HIGH (ref 98–107)
CO2 TOTAL: 27 mmol/L (ref 21–31)
CREATININE: 2.2 mg/dL — ABNORMAL HIGH (ref 0.60–1.30)
ESTIMATED GFR: 31 mL/min/{1.73_m2} — ABNORMAL LOW (ref >59)
GLUCOSE: 109 mg/dL (ref 74–109)
OSMOLALITY, CALCULATED: 290 mOsm/kg (ref 270–290)
POTASSIUM: 4.8 mmol/L (ref 3.5–5.1)
SODIUM: 141 mmol/L (ref 136–145)

## 2023-05-29 LAB — PARATHYROID HORMONE (PTH): PTH: 95.2 pg/mL — ABNORMAL HIGH (ref 12.0–88.0)

## 2023-05-29 LAB — PROTEIN/CREATININE RATIO, URINE, RANDOM
CREATININE RANDOM URINE: 69 mg/dL (ref 30–125)
PROTEIN RANDOM URINE: 5 mg/dL — ABNORMAL LOW (ref 50–80)
PROTEIN/CREATININE RATIO RANDOM URINE: 0.072 mg/mg (ref 0.000–200.000)

## 2023-05-29 LAB — MICROALBUMIN/CREATININE RATIO, URINE, RANDOM
CREATININE RANDOM URINE: 69 mg/dL (ref 30–125)
MICROALBUMIN RANDOM URINE: <0.7 mg/dL

## 2023-05-29 LAB — MAGNESIUM: MAGNESIUM: 1.9 mg/dL (ref 1.9–2.7)

## 2023-05-29 LAB — VITAMIN D 25 TOTAL: VITAMIN D 25, TOTAL: 49.9 ng/mL (ref 30.00–100.00)

## 2023-05-29 LAB — URIC ACID: URIC ACID: 5.4 mg/dL (ref 2.3–7.6)

## 2023-05-31 ENCOUNTER — Other Ambulatory Visit (INDEPENDENT_AMBULATORY_CARE_PROVIDER_SITE_OTHER): Payer: Self-pay

## 2023-06-05 ENCOUNTER — Ambulatory Visit (INDEPENDENT_AMBULATORY_CARE_PROVIDER_SITE_OTHER): Payer: MEDICARE | Admitting: Nephrology

## 2023-06-05 ENCOUNTER — Encounter (INDEPENDENT_AMBULATORY_CARE_PROVIDER_SITE_OTHER): Payer: Self-pay | Admitting: Nephrology

## 2023-06-05 ENCOUNTER — Other Ambulatory Visit: Payer: Self-pay

## 2023-06-05 VITALS — BP 116/68 | HR 60 | Ht 74.0 in | Wt 285.0 lb

## 2023-06-05 DIAGNOSIS — D631 Anemia in chronic kidney disease: Secondary | ICD-10-CM

## 2023-06-05 DIAGNOSIS — N2581 Secondary hyperparathyroidism of renal origin: Secondary | ICD-10-CM

## 2023-06-05 DIAGNOSIS — N1832 Chronic kidney disease, stage 3b (CMS HCC): Secondary | ICD-10-CM

## 2023-06-05 DIAGNOSIS — E559 Vitamin D deficiency, unspecified: Secondary | ICD-10-CM

## 2023-06-05 DIAGNOSIS — I129 Hypertensive chronic kidney disease with stage 1 through stage 4 chronic kidney disease, or unspecified chronic kidney disease: Secondary | ICD-10-CM

## 2023-06-05 DIAGNOSIS — E1122 Type 2 diabetes mellitus with diabetic chronic kidney disease: Secondary | ICD-10-CM

## 2023-06-05 DIAGNOSIS — M109 Gout, unspecified: Secondary | ICD-10-CM

## 2023-06-05 MED ORDER — DAPAGLIFLOZIN PROPANEDIOL 10 MG TABLET
10.0000 mg | ORAL_TABLET | Freq: Every day | ORAL | 0 refills | Status: DC
Start: 2023-06-05 — End: 2023-06-05

## 2023-06-05 MED ORDER — ALLOPURINOL 100 MG TABLET
100.0000 mg | ORAL_TABLET | Freq: Every day | ORAL | Status: AC
Start: 2023-06-05 — End: ?

## 2023-06-05 NOTE — Progress Notes (Signed)
NE[facility]OLOGY, [facility]  [address] [place] [address]    Progress Note    Name: Jared Mcintyre MRN:  [id number]   Date: 06/05/2023 DOB:  [birth year] (72 y.o.)              Nephrology Follow Up Visit          HPI: 72 y.o. gentleman with past medical history of chronic kidney disease stage IIIB, baseline creatinine 2.1 GFR 32.  Patient has underlying diabetes and hypertension.  Patient is here for follow-up.    Patient has been on [place] recently and he is tolerating well he is on olmesartan.  Reports mild edema.  No dysuria patient has been in good shape.      ROS:         Systematic review of 12 organ systems was negative except what mentioned in in the HPI.      OBJECTIVE:   BP 116/68   Pulse 60   Ht 1.88 m (6\' 2" )   Wt 129 kg (285 lb)   BMI 36.59 kg/m       General:  NAD, AAOx3  HEENT:  EOMI, MMM, no pallor, no icterus  NECK: No increased JVD.    HEART: Normal S1 and S2. Regular rhythm. No murmurs or rubs.   LUNGS: Clear to auscultation bilateral. No wheezes, rales, or rhonchi.   ABDOMEN: +BS, Soft, nontender and nondistended. No rebound or guarding present.   EXTREMITIES: No edema. No asterixis    NEURO : moving all extremities. emoi    LABORATORY DATA:   Lab Results   Component Value Date    BUN 35 (H) 05/29/2023    BUN 34 (H) 05/10/2023    BUN 36 (H) 11/28/2022    CREATININE 2.20 (H) 05/29/2023    CREATININE 2.37 (H) 05/10/2023    CREATININE 2.13 (H) 11/28/2022    BUNCRRATIO 16 05/29/2023    BUNCRRATIO 14 05/10/2023    BUNCRRATIO 17 11/28/2022    GFR 31 (L) 05/29/2023    GFR 28 (L) 05/10/2023    GFR 32 (L) 11/28/2022    SODIUM 141 05/29/2023    SODIUM 138 05/10/2023    SODIUM 136 11/28/2022    POTASSIUM 4.8 05/29/2023    POTASSIUM 4.9 05/10/2023    POTASSIUM 4.5 11/28/2022    CHLORIDE 108 (H) 05/29/2023    CHLORIDE 106 05/10/2023    CHLORIDE 106 11/28/2022    CO2 27 05/29/2023    CO2 20 (L) 05/10/2023    CO2 24 11/28/2022    ANIONGAP 6 05/29/2023    ANIONGAP 12 05/10/2023    ANIONGAP 6  11/28/2022    CALCIUM 9.4 05/29/2023    CALCIUM 9.2 05/10/2023    CALCIUM 9.4 11/28/2022    PHOSPHORUS 3.6 (L) 11/28/2022    PHOSPHORUS 3.7 10/11/2022    PHOSPHORUS 3.0 (L) 09/10/2022    ALBUMIN 4.4 05/10/2023    ALBUMIN 4.2 11/28/2022    ALBUMIN 4.1 10/11/2022    HGB 13.4 05/29/2023    HGB 13.2 05/10/2023    HGB 13.1 11/28/2022    HCT 40.6 05/29/2023    HCT 39.6 05/10/2023    HCT 39.6 11/28/2022    INTACTPTH 95.2 (H) 05/29/2023    INTACTPTH 70.6 11/28/2022    INTACTPTH 62.8 10/11/2022    IRON 58 11/28/2022    IRON 60 10/11/2022    IRON 35 (L) 09/10/2022    IRONBINDCAP 319 11/28/2022    IRONBINDCAP 290 10/11/2022  IRONBINDCAP 301 09/10/2022    IRONSAT 18 (L) 11/28/2022    IRONSAT 21 10/11/2022    IRONSAT 12 (L) 09/10/2022    FERRITIN 69 11/28/2022    FERRITIN 95 04/10/2022       Lab Results   Component Value Date    HA1C 6.5 (H) 10/11/2022    HA1C 6.5 (H) 09/10/2022    HA1C 6.8 (H) 04/10/2022    URICACID 5.4 05/29/2023    URICACID 5.2 11/28/2022    URICACID 5.0 10/11/2022          MEDICATIONS:  No outpatient medications have been marked as taking for the 06/05/23 encounter (Office Visit) with Rhina Brackett, MD.     Current Outpatient Medications   Medication Instructions    allopurinoL (ZYLOPRIM) 100 mg Oral Tablet EVERY HOUR    aspirin (ECOTRIN) 81 mg, Oral    calcium carbonate-vitamin D3 500 mg-5 mcg (200 unit) Oral Tablet 1 Tablet, Oral, DAILY    cholecalciferol (vitamin D3) 1,000 Units    cyanocobalamin (VITAMIN B-12) 500 mcg, Oral    folic acid (FOLVITE) 1 mg, Oral, DAILY    furosemide (LASIX) 1 mg/mL Intravenous Injectable furosemide    gabapentin (NEURONTIN) 100 mg, Oral    hydrALAZINE (APRESOLINE) 100 mg, Oral    isosorbide mononitrate (IMDUR) 30 mg, Oral, EVERY MORNING    linaGLIPtin (TRADJENTA) 5 mg, Oral, DAILY    olmesartan (BENICAR) 40 mg, Oral, DAILY    omega 3-dha-epa-fish oil 183.3 mg-75 mg -91.6 mg-306 mg Oral Capsule omega 3 183.3 mg-dha 75 mg-epa 91.6 mg-fish oil 306 mg capsule   Take by oral  route.    rosuvastatin (CRESTOR) 10 mg Oral Tablet rosuvastatin 10 mg tablet   TAKE 1 TABLET BY MOUTH EVERY DAY AT BEDTIME    tamsulosin (FLOMAX) 0.4 mg, Oral, EVERY EVENING AFTER DINNER    traZODone (DESYREL) 100 mg Oral Tablet trazodone 100 mg tablet   Take 1 tablet by oral route at bedtime.    Vitamin B12-Folic Acid 0.5-1 mg Oral Tablet Vitamin B-12         ASSESSMENT / PLAN:   ENCOUNTER DIAGNOSES     ICD-10-CM   1. Stage 3b chronic kidney disease (CMS HCC)  N18.32   2. Vitamin D deficiency  E55.9             Chronic kidney disease  -Stage IIIb  -Due to diabetes and hypertension  -Creatinine is stable 2.2  Lab Results   Component Value Date    CREATININE 2.20 (H) 05/29/2023    CREATININE 2.37 (H) 05/10/2023        -Baseline creatinine 2.1/GFR 32  -We will check UA  -Total protein to creatinine ratio-  Lab Results   Component Value Date    UPCR 0.072 05/29/2023    UPCR 0.068 10/31/2022      -UACR not significant  -CBC and a basic metabolic panel  -Return to clinic in 4 months  -Continue low-sodium diet  -balanced Fluid intake 40-50 oz a day.    -Avoid NSAIDs.  Tylenol only for pain  -ACEI or ARBS:  yes, continue olmesartan  -Sodium Glucose Cotransporter-2 (SGLT2) Inhibitors:  Continue Farxiga 10    CKD bone mineral disease  Lab Results   Component Value Date    INTACTPTH 95.2 (H) 05/29/2023    [id number] 49.90 05/29/2023    PHOSPHORUS 3.6 (L) 11/28/2022        Secondary hyperparathyroidism start calcitriol  Hypertension  -Blood pressure is acceptable  -Goal less  than 130/80  -Continue lisinopril  -Low-salt diet    Diabetes type 2  -A1c goal is less than 7   Lab Results   Component Value Date    HA1C 6.5 (H) 10/11/2022      -Continue current medications  -Diabetic diet  -Increase activity and exercise as possible  -Monitor A1C    Gout  -Check uric acid   Lab Results   Component Value Date    URICACID 5.4 05/29/2023       -Low uric acid diet  -continue current management.  Continue allopurinol    Anemia of CKD  - HB  stable  Lab Results   Component Value Date    HGB 13.4 05/29/2023             Orders Placed This Encounter    BASIC METABOLIC PANEL    CBC/DIFF    MAGNESIUM    MICROALBUMIN/CREATININE RATIO, URINE, RANDOM    PARATHYROID HORMONE (PTH)    PROTEIN/CREATININE RATIO, URINE, RANDOM    URIC ACID    VITAMIN D 25 TOTAL

## 2023-06-07 ENCOUNTER — Encounter (INDEPENDENT_AMBULATORY_CARE_PROVIDER_SITE_OTHER): Payer: Self-pay

## 2023-06-18 ENCOUNTER — Encounter (INDEPENDENT_AMBULATORY_CARE_PROVIDER_SITE_OTHER): Payer: MEDICARE | Admitting: Student in an Organized Health Care Education/Training Program

## 2023-06-21 ENCOUNTER — Encounter (INDEPENDENT_AMBULATORY_CARE_PROVIDER_SITE_OTHER): Payer: MEDICARE | Admitting: Student in an Organized Health Care Education/Training Program

## 2023-06-25 ENCOUNTER — Other Ambulatory Visit: Payer: MEDICARE | Attending: Student in an Organized Health Care Education/Training Program

## 2023-06-25 ENCOUNTER — Other Ambulatory Visit (INDEPENDENT_AMBULATORY_CARE_PROVIDER_SITE_OTHER): Payer: Self-pay | Admitting: Student in an Organized Health Care Education/Training Program

## 2023-06-25 ENCOUNTER — Other Ambulatory Visit: Payer: Self-pay

## 2023-06-25 DIAGNOSIS — R972 Elevated prostate specific antigen [PSA]: Secondary | ICD-10-CM

## 2023-06-25 LAB — PSA, DIAGNOSTIC: PSA: 3.7 ng/mL (ref <=4.00)

## 2023-07-02 ENCOUNTER — Encounter (INDEPENDENT_AMBULATORY_CARE_PROVIDER_SITE_OTHER): Payer: Self-pay | Admitting: Student in an Organized Health Care Education/Training Program

## 2023-07-02 ENCOUNTER — Ambulatory Visit (INDEPENDENT_AMBULATORY_CARE_PROVIDER_SITE_OTHER): Payer: MEDICARE | Admitting: Student in an Organized Health Care Education/Training Program

## 2023-07-02 ENCOUNTER — Other Ambulatory Visit: Payer: Self-pay

## 2023-07-02 VITALS — BP 114/52 | HR 46 | Ht 74.0 in | Wt 286.0 lb

## 2023-07-02 DIAGNOSIS — R972 Elevated prostate specific antigen [PSA]: Secondary | ICD-10-CM

## 2023-07-02 DIAGNOSIS — N529 Male erectile dysfunction, unspecified: Secondary | ICD-10-CM

## 2023-07-02 DIAGNOSIS — N400 Enlarged prostate without lower urinary tract symptoms: Secondary | ICD-10-CM

## 2023-07-02 NOTE — Progress Notes (Unsigned)
[facility]Y, [facility]  [address] [place] [address]    Progress Note    Name: Jared Mcintyre MRN:  [id number]   Date: 07/02/2023 DOB:  1951/09/14 (72 y.o.)             Chief Complaint: No chief complaint on file.  Subjective   Subjective:   Mr. Strait presents for f/.u. Most reecent PSA was 2.19 on 10/19/2021. Patient denies a fmaily history of urinary malignancies. He denies urinary complaints such as hematuria, dysuria, incontinence, sensation incomplete bladder emptying, nocturia, dribbling, hesitancy.Patient states he has a strong urine stream.  He has not had a history of prostate biopsy.  He denies a history of urologic surgery.  He denies any fevers, chills, nausea, vomiting, or abdominal pain.Remote history of tobacco use. Patient denies occupational chemical exposure. Patient states Viagra continues to help obtain and maintain erections.         PROSTATE SPECIFIC ANTIGEN   Lab Results   Component Value Date/Time    PROSSPECAG 3.70 06/25/2023 01:42 PM    PROSSPECAG 4.02 (H) 10/31/2022 10:20 AM              Objective   Objective :  There were no vitals taken for this visit.      Gen: NAD, alert  Pulm: unlabored at rest  CV: palpable pulses  Abd: soft, Nt/ND  GU: no suprapubic tenderness, no CVAT        Data reviewed:    Current Outpatient Medications   Medication Sig    allopurinoL (ZYLOPRIM) 100 mg Oral Tablet Take 1 Tablet (100 mg total) by mouth Once a day    aspirin (ECOTRIN) 81 mg Oral Tablet, Delayed Release (E.C.) Take 1 Tablet (81 mg total) by mouth    calcium carbonate-vitamin D3 500 mg-5 mcg (200 unit) Oral Tablet Take 1 Tablet by mouth Once a day    cholecalciferol, vitamin D3, 25 mcg (1,000 unit) Oral Tablet 1 Tablet (1,000 Units total)    cyanocobalamin (VITAMIN B-12) 500 mcg Oral Tablet Take 1 Tablet (500 mcg total) by mouth    dapagliflozin propanediol (FARXIGA) 10 mg Oral Tablet Take 1 Tablet (10 mg total) by mouth Once a day    folic acid (FOLVITE) 1 mg Oral Tablet Take 1  Tablet (1 mg total) by mouth Once a day    furosemide (LASIX) 1 mg/mL Intravenous Injectable furosemide    gabapentin (NEURONTIN) 100 mg Oral Capsule Take 1 Capsule (100 mg total) by mouth    hydrALAZINE (APRESOLINE) 100 mg Oral Tablet Take 1 Tablet (100 mg total) by mouth    isosorbide mononitrate (IMDUR) 30 mg Oral Tablet Sustained Release 24 hr Take 1 Tablet (30 mg total) by mouth Every morning    linaGLIPtin (TRADJENTA) 5 mg Oral Tablet Take 1 Tablet (5 mg total) by mouth Once a day    olmesartan (BENICAR) 40 mg Oral Tablet Take 1 Tablet (40 mg total) by mouth Once a day for 90 days    omega 3-dha-epa-fish oil 183.3 mg-75 mg -91.6 mg-306 mg Oral Capsule omega 3 183.3 mg-dha 75 mg-epa 91.6 mg-fish oil 306 mg capsule   Take by oral route.    rosuvastatin (CRESTOR) 10 mg Oral Tablet rosuvastatin 10 mg tablet   TAKE 1 TABLET BY MOUTH EVERY DAY AT BEDTIME    tamsulosin (FLOMAX) 0.4 mg Oral Capsule Take 1 Capsule (0.4 mg total) by mouth Every evening after dinner    traZODone (DESYREL) 100 mg  Oral Tablet trazodone 100 mg tablet   Take 1 tablet by oral route at bedtime.    Vitamin B12-Folic Acid 0.5-1 mg Oral Tablet Vitamin B-12        Assessment/Plan  Problem List Items Addressed This Visit    None        Erectile dysfunction:   I discussed the differential diagnosis, pathophysiology and nature of erectile dysfunction, including contributing risk factors in patient's case  I counseled patient on lifestyle modifications including regular cardiovascular exercise, tight glycemic control, maintenance of a healthy weight, moderate alcohol intake and smoking cessation, if applicable  Each of the various following erectile dysfunction treatment options, including risks, benefits and instructions on administration were provided to patient  Phosphodiesterase-5 inhibitor oral therapy (Sildenafil, Vardenafil, Tadalafil) - discussed potential risks of nasal congestion, headache, vision impairment including blue-green  discoloration, and/or undesired therapeutic benefit  Intracavernous injection therapy (Caverject, Edex) & intraurethral suppository (MUSE) - discussed potential risks of priapism, penile scarring with subsequent curvature, dysuria, bleeding, pain and/or undesired therapeutic benefit  Vacuum erection device - discussed potential risks of bruising, skin breakdown, penile pain, anejaculation and temporary penile numbness  Penile prosthesis surgery - discussed risks of pain, urethral injury, prosthetic infection requiring explantation, device malfunction, and  patient/partner disatisfaction   Patient concurrently taking Imdur, discontinue Viagra.         BPH/LUTS with PVR  119  I discussed the differential diagnosis, pathophysiology and nature of patient's benign prostate enlargement causing lower urinary tract symptoms  I counseled patient on further conservative management options including appropriate fluid management, avoidance of diuretics including caffeine and alcohol  Additionally, we discussed the role of pharmacotherapy, including risks, benefits and alternatives:  Alpha-blocker therapy [e.g. Tamsulosin] - potential risks of dizziness, asthenia, orthostasis, and retrograde ejaculation  5-Alpha Reductase Inhibitors [e.g. Finasteride] - potential risks of painful breast enlargement, diminished libido or erectile dysfunction, ejaculatory dysfunction, and potential concerns for increased risk of high grade prostate cancer and delayed time improved symptoms  Prescription provided for Tamsulosin Hydrochloride (FlomaxT) 0.4 mg P.O. daily:    I have discussed in great detail with the patient the treatment of prostate enlargement/lower urinary tract symptoms using tamsulosin.  I have explained my rationale for using tamsulosin as well as potential risks of asthenia (2%), dizziness (5%), rhinitis (3%) and abnormal ejaculation (11%). I encouraged patient to report any prior or current history of alpha-1 antagonist use to  their opthalmic surgeon before undergoing any eye surgery due to the risk of intraoperative floppy iris syndrome.  The patient expressed an understanding of the treatment, possible reactions, and possible prognosis.           Prostate cancer screening  Based on patient's clinical findings including his recent prostate specific antigen level, age, ethnicity and relevant risk factors and in accordance with the American Urological Association (AUA) & National Comprehensive Cancer Network (NCCN) published screening guidelines, I would recommend the following:  Continued annual prostate specific antigen & digital rectal exam screening with cessation of screening based on estimated life expectancy of less than 5 years or upon developing more serious health issues.  PSA in one year        Arlana Hove, DO     A combined total of 30 minutes were spent preparing to see the patient, reviewing previous records, ordering tests/medications/procedures, documenting the clinical encounter as well as performing a medically appropriate evaluation and independently interpreting results and communicating them to the patient/family/caregiver as specifically outlined above in the impression  and plan.    This note may have been fully or partially generated using MModal Fluency Direct system, and there may be some incorrect words, spellings, and punctuation that were not identified in checking the note before saving.

## 2023-08-05 ENCOUNTER — Encounter (HOSPITAL_BASED_OUTPATIENT_CLINIC_OR_DEPARTMENT_OTHER): Payer: Self-pay

## 2023-08-05 ENCOUNTER — Emergency Department
Admission: EM | Admit: 2023-08-05 | Discharge: 2023-08-05 | Disposition: A | Discharge status: Home / Self Care | Payer: MEDICARE | Source: Home / Self Care | Attending: Emergency Medicine | Admitting: Emergency Medicine

## 2023-08-05 ENCOUNTER — Emergency Department (HOSPITAL_BASED_OUTPATIENT_CLINIC_OR_DEPARTMENT_OTHER): Payer: MEDICARE

## 2023-08-05 ENCOUNTER — Other Ambulatory Visit: Payer: Self-pay

## 2023-08-05 DIAGNOSIS — S161XXA Strain of muscle, fascia and tendon at neck level, initial encounter: Secondary | ICD-10-CM | POA: Insufficient documentation

## 2023-08-05 DIAGNOSIS — M47812 Spondylosis without myelopathy or radiculopathy, cervical region: Secondary | ICD-10-CM | POA: Insufficient documentation

## 2023-08-05 DIAGNOSIS — M25511 Pain in right shoulder: Secondary | ICD-10-CM | POA: Insufficient documentation

## 2023-08-05 DIAGNOSIS — M62838 Other muscle spasm: Secondary | ICD-10-CM | POA: Insufficient documentation

## 2023-08-05 DIAGNOSIS — X58XXXA Exposure to other specified factors, initial encounter: Secondary | ICD-10-CM | POA: Insufficient documentation

## 2023-08-05 MED ORDER — CYCLOBENZAPRINE 10 MG TABLET
10.0000 mg | ORAL_TABLET | ORAL | Status: AC
Start: 2023-08-05 — End: 2023-08-05
  Administered 2023-08-05: 10 mg via ORAL

## 2023-08-05 MED ORDER — KETOROLAC 10 MG TABLET
10.0000 mg | ORAL_TABLET | Freq: Four times a day (QID) | ORAL | 0 refills | Status: DC | PRN
Start: 2023-08-05 — End: 2023-10-07

## 2023-08-05 MED ORDER — METHYLPREDNISOLONE ACETATE 80 MG/ML SUSPENSION FOR INJECTION
INTRAMUSCULAR | Status: AC
Start: 2023-08-05 — End: 2023-08-05
  Filled 2023-08-05: qty 1

## 2023-08-05 MED ORDER — CYCLOBENZAPRINE 10 MG TABLET
10.0000 mg | ORAL_TABLET | Freq: Three times a day (TID) | ORAL | 0 refills | Status: DC | PRN
Start: 2023-08-05 — End: 2023-10-07

## 2023-08-05 MED ORDER — CYCLOBENZAPRINE 10 MG TABLET
ORAL_TABLET | ORAL | Status: AC
Start: 2023-08-05 — End: 2023-08-05
  Filled 2023-08-05: qty 1

## 2023-08-05 MED ORDER — KETOROLAC 30 MG/ML (1 ML) INJECTION SOLUTION
INTRAMUSCULAR | Status: AC
Start: 2023-08-05 — End: 2023-08-05
  Filled 2023-08-05: qty 1

## 2023-08-05 MED ORDER — KETOROLAC 30 MG/ML (1 ML) INJECTION SOLUTION
30.0000 mg | INTRAMUSCULAR | Status: AC
Start: 2023-08-05 — End: 2023-08-05
  Administered 2023-08-05: 30 mg via INTRAMUSCULAR

## 2023-08-05 MED ORDER — METHYLPREDNISOLONE ACETATE 80 MG/ML SUSPENSION FOR INJECTION
80.0000 mg | Freq: Once | INTRAMUSCULAR | Status: AC
Start: 2023-08-05 — End: 2023-08-05
  Administered 2023-08-05: 80 mg via INTRAMUSCULAR

## 2023-08-05 NOTE — ED Provider Notes (Signed)
[facility], [facility] - Emergency Department  ED Primary Provider Note  History of Present Illness   Chief Complaint   Patient presents with    Shoulder Pain     right     Jared Mcintyre is a 73 y.o. male who had concerns including Shoulder Pain.  Arrival: The patient arrived by Car complaining right-sided posterior neck pain with pain in the trapezius muscle, deltoid muscle, in triceps muscle.  He denies any weakness.  He denies any numbness or tingling going down the arm.  Patient states pain has gotten progressively worse where he is having muscle spasm in his trapezius muscle.  Patient is unable to move his arm without severe pain shoulder.  He denies any lifting anything heavy or falling.    HPI  Review of Systems   Review of Systems   Constitutional:  Positive for activity change. Negative for chills and fever.   HENT:  Negative for ear pain and sore throat.    Eyes:  Negative for pain and visual disturbance.   Respiratory:  Negative for cough and shortness of breath.    Cardiovascular:  Negative for chest pain and palpitations.   Gastrointestinal:  Negative for abdominal pain and vomiting.   Genitourinary:  Negative for dysuria and hematuria.   Musculoskeletal:  Positive for arthralgias, back pain, myalgias and neck pain.   Skin:  Negative for color change and rash.   Neurological:  Negative for seizures and syncope.   All other systems reviewed and are negative.     Historical Data   History Reviewed This Encounter:     Physical Exam   ED Triage Vitals [08/05/23 1358]   BP (Non-Invasive) 133/77   Heart Rate 55   Respiratory Rate 18   Temperature 36.2 C (97.2 F)   SpO2 100 %   Weight 129 kg (285 lb)   Height 1.88 m (6\' 2" )     Physical Exam  Vitals and nursing note reviewed.   Constitutional:       General: He is not in acute distress.     Appearance: Normal appearance. He is well-developed and normal weight.   HENT:      Head: Normocephalic and atraumatic.      Right Ear: External ear  normal.      Left Ear: External ear normal.      Nose: Nose normal.      Mouth/Throat:      Mouth: Mucous membranes are dry.   Eyes:      Extraocular Movements: Extraocular movements intact.      Conjunctiva/sclera: Conjunctivae normal.      Pupils: Pupils are equal, round, and reactive to light.   Neck:      Comments: Positive tenderness over the right posterior lateral aspect C5-C6.  Positive tenderness over the trapezius muscle on the right.  Positive tenderness over the deltoid muscle as well as the triceps muscle.  Cardiovascular:      Rate and Rhythm: Normal rate and regular rhythm.      Heart sounds: No murmur heard.  Pulmonary:      Effort: Pulmonary effort is normal. No respiratory distress.      Breath sounds: Normal breath sounds.   Abdominal:      Palpations: Abdomen is soft.      Tenderness: There is no abdominal tenderness.   Musculoskeletal:         General: Tenderness present. No swelling.      Cervical back: Neck supple.  Tenderness present.      Comments: Tenderness over the posterior trapezius muscle on the right.  Tenderness of the deltoid and triceps muscle.   Skin:     General: Skin is warm and dry.      Capillary Refill: Capillary refill takes less than 2 seconds.   Neurological:      General: No focal deficit present.      Mental Status: He is alert and oriented to person, place, and time.   Psychiatric:         Mood and Affect: Mood normal.         Behavior: Behavior normal.         Thought Content: Thought content normal.       Patient Data   Labs Ordered/Reviewed - No data to display  CT CERVICAL SPINE WO IV CONTRAST   Final Result by Edi, Radresults In (11/11 1519)   NO ACUTE CERVICAL FRACTURE.  DEGENERATIVE CHANGES.         One or more dose reduction techniques were used (e.g., Automated exposure control, adjustment of the mA and/or kV according to patient size, use of iterative reconstruction technique).         Radiologist location ID: [id number]           Medical Decision Making         Medical Decision Making  Patient is 72 year old black male complaining of neck pain on the right side radiating to his right trapezius muscle as well as deltoid and triceps.  Denies any lifting of heavy objects.  Also denies any recent fall.  He denied injuring his shoulder in the past.  Patient will CT scan of his cervical column.  Patient denies any numbness or tingling going down [place].  He denies any one-sided weakness.  Patient will be treated for results and then possibly discharged home.  He will follow up with orthopedic in the next 2-3 days.    Amount and/or Complexity of Data Reviewed  Radiology: ordered.    Risk  Prescription drug management.             Medications Administered in the ED   ketorolac (TORADOL) 30 mg/mL injection (has no administration in time range)   cyclobenzaprine (FLEXERIL) tablet (has no administration in time range)   methylPREDNISolone acetate (DEPO-medrol) 80 mg/mL injection (has no administration in time range)     Clinical Impression   Cervical strain, acute, initial encounter (Primary)   DJD (degenerative joint disease), cervical   Muscle spasm       Disposition: Discharged               Clinical Impression   Cervical strain, acute, initial encounter (Primary)   DJD (degenerative joint disease), cervical   Muscle spasm       Current Discharge Medication List        START taking these medications    Details   cyclobenzaprine (FLEXERIL) 10 mg Oral Tablet Take 1 Tablet (10 mg total) by mouth Three times a day as needed for Muscle spasms  Qty: 12 Tablet, Refills: 0      ketorolac tromethamine (TORADOL) 10 mg Oral Tablet Take 1 Tablet (10 mg total) by mouth Every 6 hours as needed for Pain  Qty: 20 Tablet, Refills: 0

## 2023-08-05 NOTE — ED Triage Notes (Addendum)
Right shoulder pain ongoing for about a week starts in the back of the shoulder and radiates down to the elbow and has spasms and sore to touch. Had surgery year ago on the ulna nerve.

## 2023-08-05 NOTE — ED Nurses Note (Signed)
 Patient discharged home all instructions and test results gone over with patient including medications with verbalized understanding. Patient ambulated off unit independently.

## 2023-08-06 ENCOUNTER — Telehealth (HOSPITAL_COMMUNITY): Payer: Self-pay

## 2023-08-06 NOTE — Telephone Encounter (Signed)
Post Ed Follow-Up    Post ED Follow-Up:   Document completed and/or attempted interactive contact(s) after transition to home after emergency department stay.:   Transition Facility and relevant Date:   Discharge Date: 08/04/23  Discharge from [facility] Emergency Department?: Yes  Discharge Facility: [facility]  Contacted by: Lum Babe, RN  Contact method: Patient/Caregiver Telephone, MyChart Patient Portal  Contact first attempt: 08/06/2023  1:47 PM

## 2023-09-11 ENCOUNTER — Other Ambulatory Visit (INDEPENDENT_AMBULATORY_CARE_PROVIDER_SITE_OTHER): Payer: Self-pay | Admitting: Nephrology

## 2023-09-11 NOTE — Telephone Encounter (Signed)
Doctor Retired In [employer]

## 2023-09-14 ENCOUNTER — Other Ambulatory Visit (INDEPENDENT_AMBULATORY_CARE_PROVIDER_SITE_OTHER): Payer: Self-pay | Admitting: Nephrology

## 2023-09-16 NOTE — Telephone Encounter (Signed)
Done with Treatment

## 2023-09-19 ENCOUNTER — Other Ambulatory Visit (INDEPENDENT_AMBULATORY_CARE_PROVIDER_SITE_OTHER): Payer: Self-pay | Admitting: Nephrology

## 2023-09-19 DIAGNOSIS — N1832 Chronic kidney disease, stage 3b (CMS HCC): Secondary | ICD-10-CM

## 2023-09-19 MED ORDER — FOLIC ACID 1 MG TABLET
1.0000 mg | ORAL_TABLET | Freq: Every day | ORAL | 0 refills | Status: AC
Start: 2023-09-19 — End: ?

## 2023-09-27 IMAGING — MR MRI CERVICAL SPINE WITHOUT CONTRAST
4 of 5 series · 23 of 48 positions shown · IV contrast (gadolinium)
Comparison: C-spine x-ray 09/23/2023.

﻿EXAM:  [id number]   MRI CERVICAL SPINE WITHOUT CONTRAST
INDICATION: 72-year-old with chronic cervical radiculopathy with tingling and numbness for one month.  No history of C-spine surgery.
TECHNIQUE: Multiplanar multisequential MRI of the cervical spine was performed without gadolinium contrast.

[Series 5: T2 · sagittal · 4.0mm · 0.75mm/px · 8 of 13 slices shown (1 of 2)]
[im 1/13]
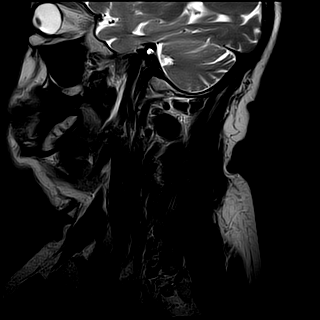
[im 2/13]
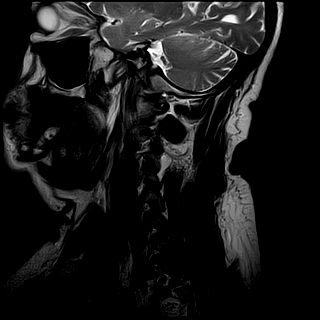
[im 4/13]
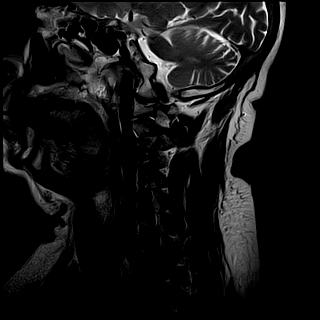
[im 6/13]
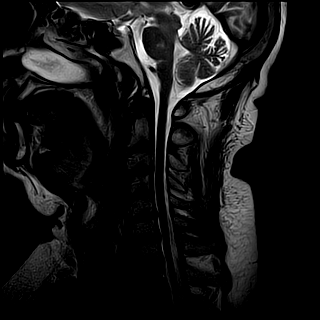
[im 7/13]
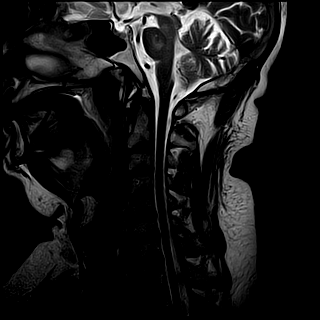
[im 9/13]
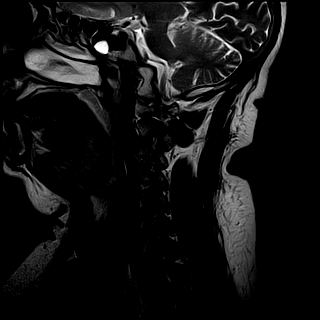
[im 11/13]
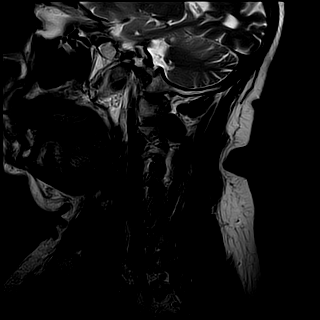
[im 13/13]
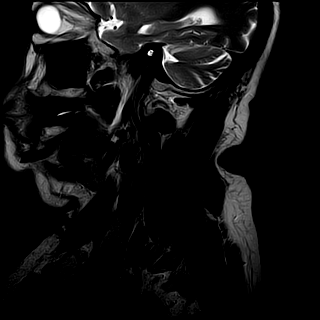

[Series 6: T1 · sagittal · 4.0mm · 0.47mm/px · 3 of 13 slices shown]
[im 2/13]
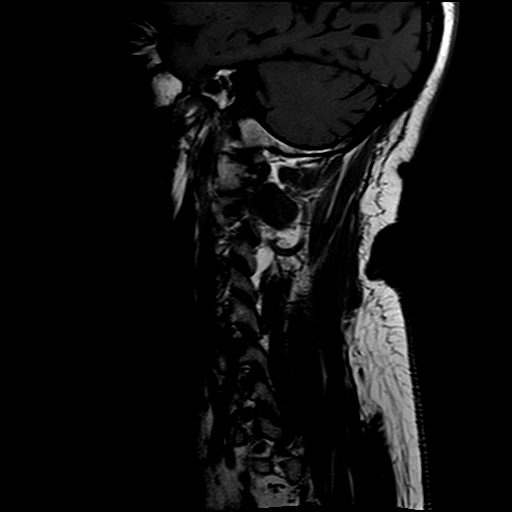
[im 7/13]
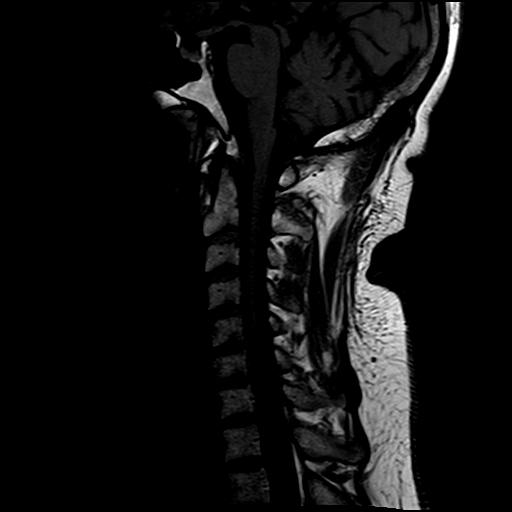
[im 11/13]
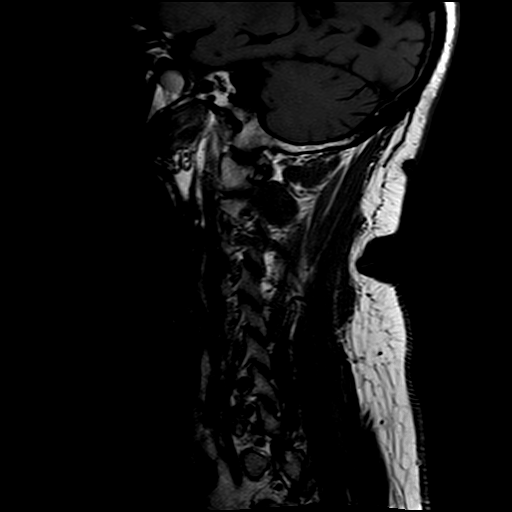

[Series 7: STIR · sagittal · 4.0mm · 0.47mm/px · 3 of 13 slices shown]
[im 2/13]
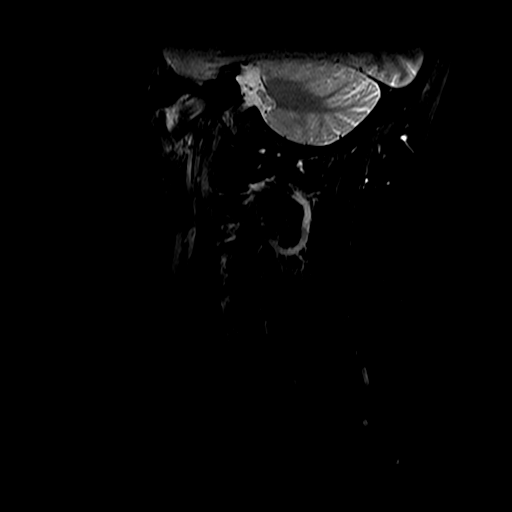
[im 7/13]
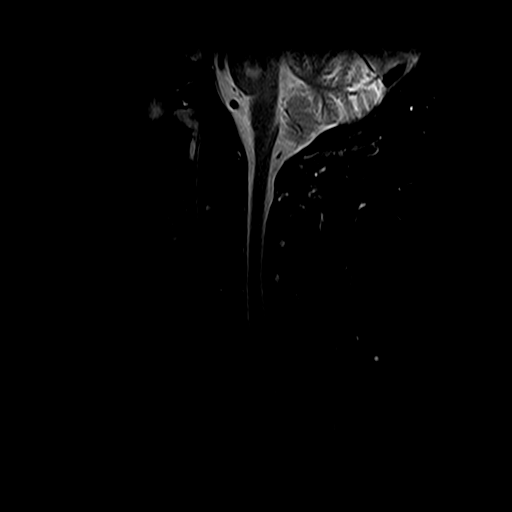
[im 11/13]
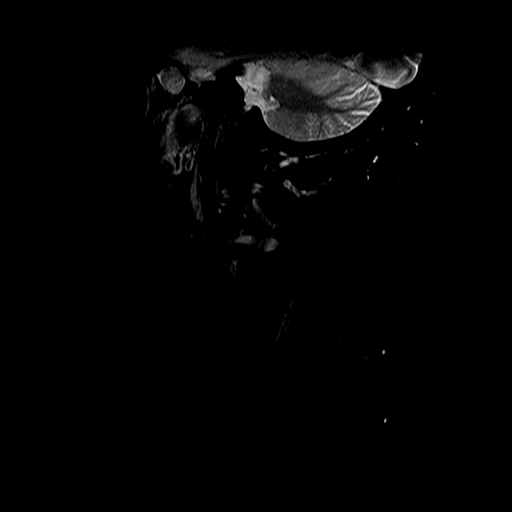

[Series 9: T2 · axial · 3.0mm · 0.39mm/px · z∈[-151,-56]mm · 9 of 18 slices shown (2 of 2)]
[im 1/18]
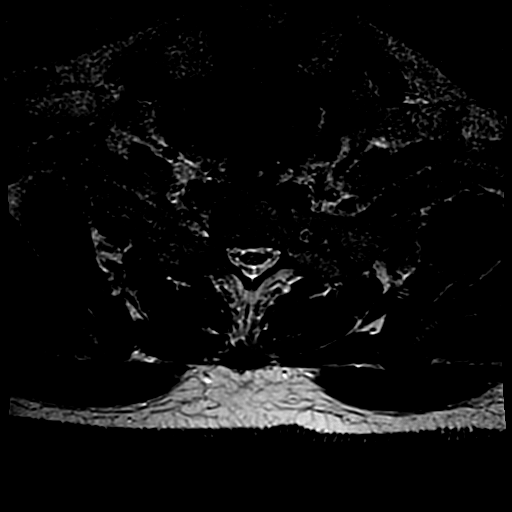
[im 4/18]
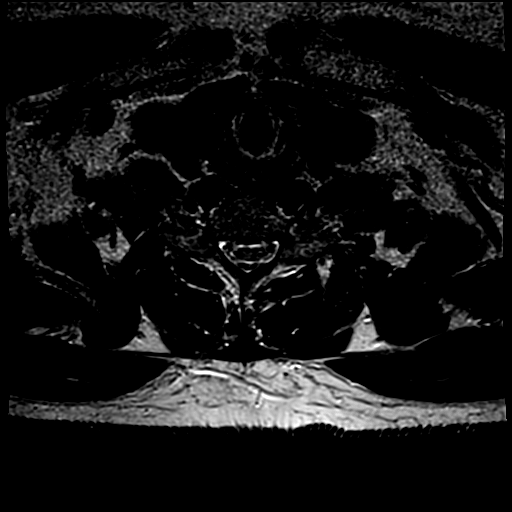
[im 5/18]
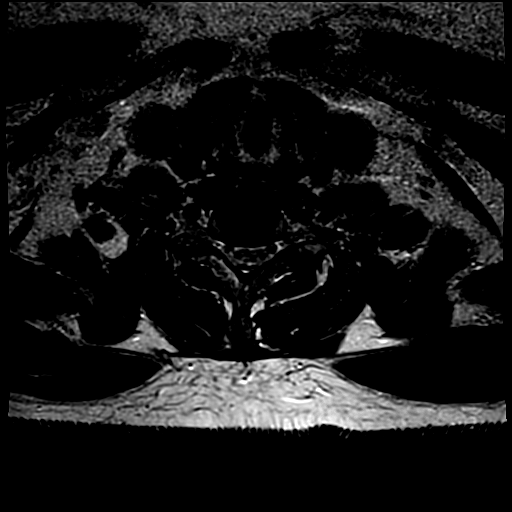
[im 8/18]
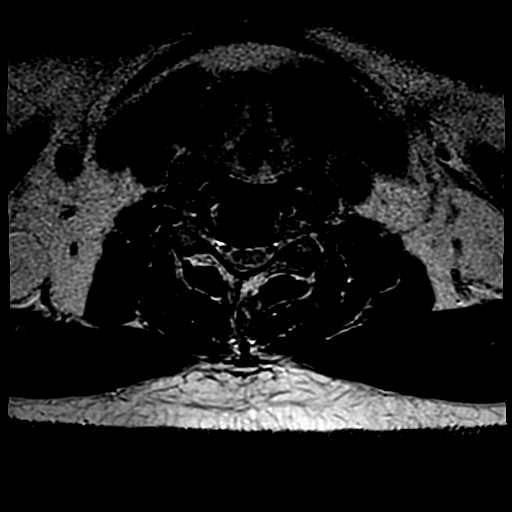
[im 10/18]
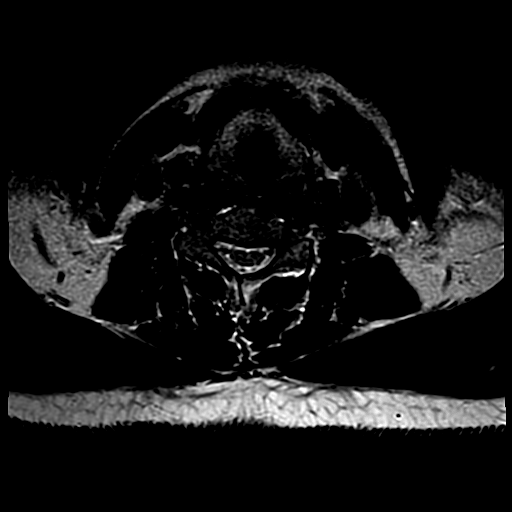
[im 13/18]
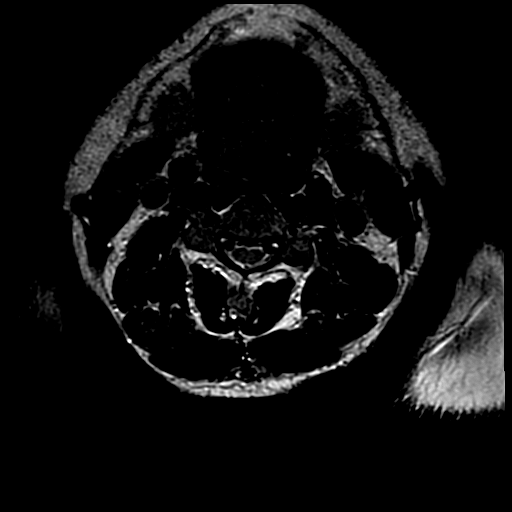
[im 14/18]
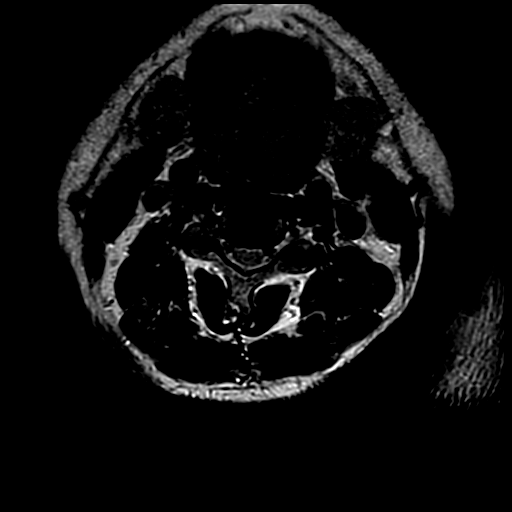
[im 16/18]
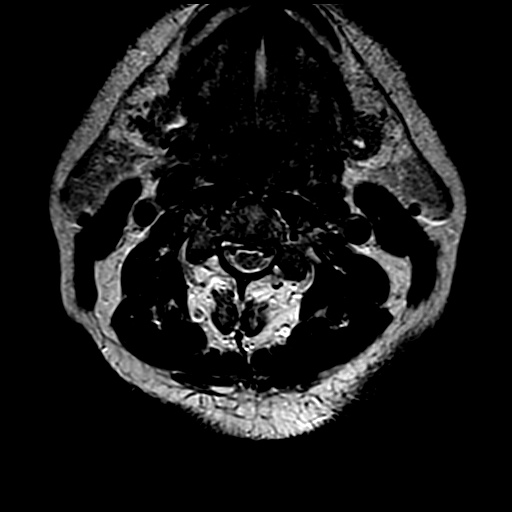
[im 18/18]
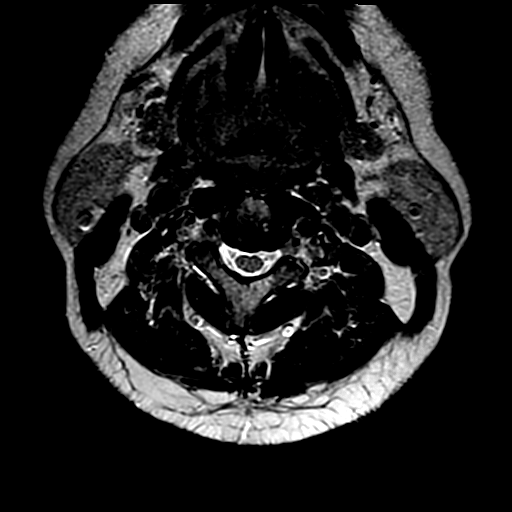

[23 of 48 positions shown; findings below may reference images not displayed]

FINDINGS: No acute bony lesions of cervical vertebrae are seen.  Foramen magnum structures are normal in the sagittal projection.  

At C2-3 level degenerative disc disease with bulging annulus minimally impinges on thecal sac in the midline.  

At C3-4 level degenerative disease is noted with osteophyte complex causing moderate right foraminal narrowing.  Minimal compromise of thecal sac in the midline with AP diameter measuring 7.3 mm.  

At C4-5 level no focal disc lesions are seen.  

At C5-6 level degenerative disc disease with bulging annulus is causing mild compromise of thecal sac and both neural foramina.  AP diameter of the thecal sac in the midline measures 8.2 mm.  

C6-7 disc shows asymmetric bulging annulus to the right causing mild to moderate right foraminal narrowing.  

C7-T1 disc shows bulging anulus causing moderate biforaminal narrowing.  

Cervical spinal cord shows no focal abnormalities.  Paravertebral soft tissues are unremarkable.
IMPRESSION: 1. No acute bony lesions of cervical vertebrae.  

2. Multilevel degenerative changes as described above in detail at each level.  

3. Cervical spinal cord shows no focal lesions.

## 2023-09-30 ENCOUNTER — Other Ambulatory Visit: Payer: Self-pay

## 2023-09-30 ENCOUNTER — Other Ambulatory Visit (INDEPENDENT_AMBULATORY_CARE_PROVIDER_SITE_OTHER): Payer: Self-pay

## 2023-09-30 ENCOUNTER — Ambulatory Visit: Payer: MEDICARE | Attending: Nephrology

## 2023-09-30 DIAGNOSIS — E559 Vitamin D deficiency, unspecified: Secondary | ICD-10-CM | POA: Insufficient documentation

## 2023-09-30 DIAGNOSIS — N1832 Chronic kidney disease, stage 3b: Secondary | ICD-10-CM | POA: Insufficient documentation

## 2023-09-30 LAB — CBC WITH DIFF
BASOPHIL #: 0.1 10*3/uL (ref 0.00–0.10)
BASOPHIL %: 1 % (ref 0–1)
EOSINOPHIL #: 0.1 10*3/uL (ref 0.00–0.50)
EOSINOPHIL %: 1 % (ref 1–8)
HCT: 44.5 % (ref 36.7–47.1)
HGB: 14.5 g/dL (ref 12.5–16.3)
LYMPHOCYTE #: 1 10*3/uL (ref 1.00–3.00)
LYMPHOCYTE %: 17 % (ref 16–44)
MCH: 28.6 pg (ref 23.8–33.4)
MCHC: 32.6 g/dL (ref 32.5–36.3)
MCV: 87.7 fL (ref 73.0–96.2)
MONOCYTE #: 0.6 10*3/uL (ref 0.30–1.00)
MONOCYTE %: 10 % (ref 5–13)
MPV: 9.7 fL (ref 7.4–11.4)
NEUTROPHIL #: 4.3 10*3/uL (ref 1.85–7.80)
NEUTROPHIL %: 71 % (ref 43–77)
PLATELETS: 128 10*3/uL — ABNORMAL LOW (ref 140–440)
RBC: 5.07 10*6/uL (ref 4.06–5.63)
RDW: 15.5 % (ref 12.1–16.2)
WBC: 6.1 10*3/uL (ref 3.6–10.2)

## 2023-09-30 LAB — PROTEIN/CREATININE RATIO, URINE, RANDOM
CREATININE RANDOM URINE: 91 mg/dL (ref 30–125)
PROTEIN RANDOM URINE: 8 mg/dL — ABNORMAL LOW (ref 50–80)
PROTEIN/CREATININE RATIO RANDOM URINE: 0.088 mg/mg (ref 0.000–200.000)

## 2023-09-30 LAB — BASIC METABOLIC PANEL
ANION GAP: 10 mmol/L (ref 4–13)
BUN/CREA RATIO: 15 (ref 6–22)
BUN: 29 mg/dL — ABNORMAL HIGH (ref 7–25)
CALCIUM: 9.5 mg/dL (ref 8.6–10.3)
CHLORIDE: 104 mmol/L (ref 98–107)
CO2 TOTAL: 25 mmol/L (ref 21–31)
CREATININE: 2 mg/dL — ABNORMAL HIGH (ref 0.60–1.30)
ESTIMATED GFR: 35 mL/min/{1.73_m2} — ABNORMAL LOW (ref >59)
GLUCOSE: 101 mg/dL (ref 74–109)
OSMOLALITY, CALCULATED: 284 mosm/kg (ref 270–290)
POTASSIUM: 4.8 mmol/L (ref 3.5–5.1)
SODIUM: 139 mmol/L (ref 136–145)

## 2023-09-30 LAB — PARATHYROID HORMONE (PTH): PTH: 66.4 pg/mL (ref 12.0–88.0)

## 2023-09-30 LAB — MICROALBUMIN/CREATININE RATIO, URINE, RANDOM
CREATININE RANDOM URINE: 91 mg/dL (ref 30–125)
MICROALBUMIN RANDOM URINE: 0.8 mg/dL
MICROALBUMIN/CREATININE RATIO RANDOM URINE: 8.8 mg/g

## 2023-09-30 LAB — URIC ACID: URIC ACID: 4.5 mg/dL (ref 2.3–7.6)

## 2023-09-30 LAB — VITAMIN D 25 TOTAL: VITAMIN D 25, TOTAL: 47.68 ng/mL (ref 30.00–100.00)

## 2023-09-30 LAB — MAGNESIUM: MAGNESIUM: 2 mg/dL (ref 1.9–2.7)

## 2023-10-03 ENCOUNTER — Other Ambulatory Visit (INDEPENDENT_AMBULATORY_CARE_PROVIDER_SITE_OTHER): Payer: Self-pay

## 2023-10-07 ENCOUNTER — Ambulatory Visit (INDEPENDENT_AMBULATORY_CARE_PROVIDER_SITE_OTHER): Payer: MEDICARE | Admitting: Nephrology

## 2023-10-07 ENCOUNTER — Encounter (INDEPENDENT_AMBULATORY_CARE_PROVIDER_SITE_OTHER): Payer: Self-pay | Admitting: Nephrology

## 2023-10-07 ENCOUNTER — Other Ambulatory Visit: Payer: Self-pay

## 2023-10-07 VITALS — BP 125/72 | HR 60 | Ht 74.0 in | Wt 270.0 lb

## 2023-10-07 DIAGNOSIS — D631 Anemia in chronic kidney disease: Secondary | ICD-10-CM

## 2023-10-07 DIAGNOSIS — N2581 Secondary hyperparathyroidism of renal origin: Secondary | ICD-10-CM

## 2023-10-07 DIAGNOSIS — E559 Vitamin D deficiency, unspecified: Secondary | ICD-10-CM

## 2023-10-07 DIAGNOSIS — N1832 Chronic kidney disease, stage 3b (CMS HCC): Secondary | ICD-10-CM

## 2023-10-07 DIAGNOSIS — E1122 Type 2 diabetes mellitus with diabetic chronic kidney disease: Secondary | ICD-10-CM

## 2023-10-07 DIAGNOSIS — I129 Hypertensive chronic kidney disease with stage 1 through stage 4 chronic kidney disease, or unspecified chronic kidney disease: Secondary | ICD-10-CM

## 2023-10-07 DIAGNOSIS — Z7984 Long term (current) use of oral hypoglycemic drugs: Secondary | ICD-10-CM

## 2023-10-07 DIAGNOSIS — M109 Gout, unspecified: Secondary | ICD-10-CM

## 2023-10-07 NOTE — Progress Notes (Signed)
NEPHROLOGY, [facility]  [address] [address]    Progress Note    Name: Jared Mcintyre MRN:  [id number]   Date: 10/07/2023 DOB:  02-05-1951 (73 y.o.)              Nephrology Follow Up Visit          HPI: 73 y.o. gentleman with past medical history of chronic kidney disease stage IIIB, baseline creatinine 2.1 GFR 32.  Patient has underlying diabetes and hypertension.  Patient is here for follow-up.    Patient has been on [place] recently and he is tolerating well he is on olmesartan.  Reports no edema.  No dysuria patient has been in good shape.  Remains on Flomax.  Patient has lost about 20 lb intentional.      ROS:         Systematic review of 12 organ systems was negative except what mentioned in in the HPI.      OBJECTIVE:   BP 125/72   Pulse 60   Ht 1.88 m (6\' 2" )   Wt 122 kg (270 lb)   BMI 34.67 kg/m       General:  NAD, AAOx3  HEENT:  EOMI, MMM, no pallor, no icterus  NECK: No increased JVD.    HEART: Normal S1 and S2. Regular rhythm. No murmurs or rubs.   LUNGS: Clear to auscultation bilateral. No wheezes, rales, or rhonchi.   ABDOMEN: +BS, Soft, nontender and nondistended. No rebound or guarding present.   EXTREMITIES: No edema. No asterixis    NEURO : moving all extremities. emoi    LABORATORY DATA:   Lab Results   Component Value Date    BUN 29 (H) 09/30/2023    BUN 35 (H) 05/29/2023    BUN 34 (H) 05/10/2023    CREATININE 2.00 (H) 09/30/2023    CREATININE 2.20 (H) 05/29/2023    CREATININE 2.37 (H) 05/10/2023    BUNCRRATIO 15 09/30/2023    BUNCRRATIO 16 05/29/2023    BUNCRRATIO 14 05/10/2023    GFR 35 (L) 09/30/2023    GFR 31 (L) 05/29/2023    GFR 28 (L) 05/10/2023    SODIUM 139 09/30/2023    SODIUM 141 05/29/2023    SODIUM 138 05/10/2023    POTASSIUM 4.8 09/30/2023    POTASSIUM 4.8 05/29/2023    POTASSIUM 4.9 05/10/2023    CHLORIDE 104 09/30/2023    CHLORIDE 108 (H) 05/29/2023    CHLORIDE 106 05/10/2023    CO2 25 09/30/2023    CO2 27 05/29/2023    CO2 20 (L) 05/10/2023     ANIONGAP 10 09/30/2023    ANIONGAP 6 05/29/2023    ANIONGAP 12 05/10/2023    CALCIUM 9.5 09/30/2023    CALCIUM 9.4 05/29/2023    CALCIUM 9.2 05/10/2023    PHOSPHORUS 3.6 (L) 11/28/2022    PHOSPHORUS 3.7 10/11/2022    PHOSPHORUS 3.0 (L) 09/10/2022    ALBUMIN 4.4 05/10/2023    ALBUMIN 4.2 11/28/2022    ALBUMIN 4.1 10/11/2022    HGB 14.5 09/30/2023    HGB 13.4 05/29/2023    HGB 13.2 05/10/2023    HCT 44.5 09/30/2023    HCT 40.6 05/29/2023    HCT 39.6 05/10/2023    INTACTPTH 66.4 09/30/2023    INTACTPTH 95.2 (H) 05/29/2023    INTACTPTH 70.6 11/28/2022    IRON 58 11/28/2022    IRON 60 10/11/2022    IRON 35 (L) 09/10/2022  IRONBINDCAP 319 11/28/2022    IRONBINDCAP 290 10/11/2022    IRONBINDCAP 301 09/10/2022    IRONSAT 18 (L) 11/28/2022    IRONSAT 21 10/11/2022    IRONSAT 12 (L) 09/10/2022    FERRITIN 69 11/28/2022    FERRITIN 95 04/10/2022       Lab Results   Component Value Date    HA1C 6.5 (H) 10/11/2022    HA1C 6.5 (H) 09/10/2022    HA1C 6.8 (H) 04/10/2022    URICACID 4.5 09/30/2023    URICACID 5.4 05/29/2023    URICACID 5.2 11/28/2022          MEDICATIONS:  No outpatient medications have been marked as taking for the 10/07/23 encounter (Office Visit) with Rhina Brackett, MD.     Current Outpatient Medications   Medication Instructions    allopurinoL (ZYLOPRIM) 100 mg, Oral, DAILY    aspirin (ECOTRIN) 81 mg, Oral    calcium carbonate-vitamin D3 500 mg-5 mcg (200 unit) Oral Tablet 1 Tablet, Oral, DAILY    cholecalciferol (vitamin D3) 1,000 Units    cyanocobalamin (VITAMIN B-12) 500 mcg, Oral    dapagliflozin propanediol (FARXIGA) 10 mg, Oral, DAILY    folic acid (FOLVITE) 1 mg, Oral, DAILY    hydrALAZINE (APRESOLINE) 100 mg, Oral    isosorbide mononitrate (IMDUR) 30 mg, Oral, EVERY MORNING    linaGLIPtin (TRADJENTA) 5 mg, Oral, DAILY    omega 3-dha-epa-fish oil 183.3 mg-75 mg -91.6 mg-306 mg Oral Capsule omega 3 183.3 mg-dha 75 mg-epa 91.6 mg-fish oil 306 mg capsule   Take by oral route.    rosuvastatin (CRESTOR) 10 mg  Oral Tablet rosuvastatin 10 mg tablet   TAKE 1 TABLET BY MOUTH EVERY DAY AT BEDTIME    tamsulosin (FLOMAX) 0.4 mg, Oral, EVERY EVENING AFTER DINNER    traZODone (DESYREL) 100 mg Oral Tablet trazodone 100 mg tablet   Take 1 tablet by oral route at bedtime.         ASSESSMENT / PLAN:   ENCOUNTER DIAGNOSES     ICD-10-CM   1. Stage 3b chronic kidney disease (CMS HCC)  N18.32   2. Vitamin D deficiency  E55.9             Chronic kidney disease  -Stage IIIb  -Due to diabetes and hypertension  -Creatinine is stable 2.2  Lab Results   Component Value Date    CREATININE 2.00 (H) 09/30/2023    CREATININE 2.20 (H) 05/29/2023        -Baseline creatinine 2.1/GFR 32  -Total protein to creatinine ratio-  Lab Results   Component Value Date    UPCR 0.088 09/30/2023    UPCR 0.072 05/29/2023      -UACR not significant  -CBC and a basic metabolic panel  -Return to clinic in 4 months  -Continue low-sodium diet  -balanced Fluid intake 40-50 oz a day.    -Avoid NSAIDs.  Tylenol only for pain  -ACEI or ARBS:  yes, continue olmesartan  -Sodium Glucose Cotransporter-2 (SGLT2) Inhibitors:  Continue Farxiga 10    CKD bone mineral disease  Lab Results   Component Value Date    INTACTPTH 66.4 09/30/2023    VITAMIND25 47.68 09/30/2023    PHOSPHORUS 3.6 (L) 11/28/2022        Secondary hyperparathyroidism .  PTH is at goal     Hypertension  -Blood pressure is acceptable  -Goal less than 130/80  -Continue olmesartan and Farxiga  -Low-salt diet    Diabetes type 2  -A1c  goal is less than 7   Lab Results   Component Value Date    HA1C 6.5 (H) 10/11/2022      -Continue current medications  -Diabetic diet  -Increase activity and exercise as possible  -Monitor A1C    Gout  -Check uric acid   Lab Results   Component Value Date    URICACID 4.5 09/30/2023       -Low uric acid diet  -continue current management.  Continue allopurinol    Anemia of CKD  - HB stable  Lab Results   Component Value Date    HGB 14.5 09/30/2023             Orders Placed This  Encounter    BASIC METABOLIC PANEL    CBC/DIFF    MAGNESIUM    MICROALBUMIN/CREATININE RATIO, URINE, RANDOM    PARATHYROID HORMONE (PTH)    PROTEIN/CREATININE RATIO, URINE, RANDOM    URIC ACID    VITAMIN D 25 TOTAL

## 2023-10-10 ENCOUNTER — Encounter (INDEPENDENT_AMBULATORY_CARE_PROVIDER_SITE_OTHER): Payer: Self-pay | Admitting: Nephrology

## 2023-12-25 ENCOUNTER — Other Ambulatory Visit (INDEPENDENT_AMBULATORY_CARE_PROVIDER_SITE_OTHER): Payer: Self-pay | Admitting: Physician Assistant

## 2024-01-07 ENCOUNTER — Other Ambulatory Visit: Payer: Self-pay

## 2024-01-08 ENCOUNTER — Encounter (INDEPENDENT_AMBULATORY_CARE_PROVIDER_SITE_OTHER): Payer: Self-pay | Admitting: NURSE PRACTITIONER

## 2024-01-08 ENCOUNTER — Ambulatory Visit: Payer: MEDICARE | Attending: NURSE PRACTITIONER | Admitting: NURSE PRACTITIONER

## 2024-01-08 ENCOUNTER — Other Ambulatory Visit: Payer: Self-pay

## 2024-01-08 ENCOUNTER — Ambulatory Visit (INDEPENDENT_AMBULATORY_CARE_PROVIDER_SITE_OTHER): Admission: RE | Admit: 2024-01-08 | Payer: MEDICARE | Source: Ambulatory Visit

## 2024-01-08 VITALS — BP 115/63 | HR 53 | Temp 97.5°F | Ht 74.0 in | Wt 276.7 lb

## 2024-01-08 DIAGNOSIS — D693 Immune thrombocytopenic purpura: Secondary | ICD-10-CM | POA: Insufficient documentation

## 2024-01-08 DIAGNOSIS — Z862 Personal history of diseases of the blood and blood-forming organs and certain disorders involving the immune mechanism: Secondary | ICD-10-CM

## 2024-01-08 NOTE — Cancer Center Note (Signed)
 Department of Hematology/Oncology  Return patient visit    Name: Jared Mcintyre  UJW:[id number]  Date of Birth: 1951-04-05  Encounter Date: 01/08/2024    REFERRING PROVIDER:  Annette Killings, MD  [address] [facility]  [place],  [place] [address]    REASON FOR OFFICE VISIT:   Follow up on ITP.    HISTORY OF PRESENT ILLNESS:  Jared Mcintyre is a 73 y.o. 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  He reports that he is doing well.  He denies any bleeding issues. He had labs completed in January 2025 that showed a Plt Count of 128K.   The patient has had a good appetite and stable weight.  The patient has not noted any new areas of disease on own self exam.  The patient has not had any chest pain or dyspnea.  The patient has not had any headaches or changes in vision.  The patient has not had any abdominal pain, nausea, or vomiting.  There have been no changes in bowel or bladder habits.  The patient has not had any abnormal bleeding or clotting episodes.  There have been no complaints of fever, chills, cough, sputum production, dysuria, or diarrhea.          ROS:   Review of Systems   Constitutional:  Positive for chills. Negative for fatigue and fever.            HENT:  Negative.     Eyes: Negative.    Respiratory:  Negative for shortness of breath.    Cardiovascular:  Positive for chest pain (occasionally see Dr. Pandora Bogaert for this.).   Gastrointestinal:  Negative for abdominal pain, diarrhea, nausea and vomiting.   Endocrine: Negative.    Genitourinary:  Negative for difficulty urinating and hematuria.    Musculoskeletal:  Positive for back pain (mid lower back from previous injury). Negative for arthralgias.   Neurological:  Positive for dizziness (often). Negative for headaches.   Hematological:  Negative for adenopathy. Does not bruise/bleed easily.   Psychiatric/Behavioral: Negative.          History:  Past Medical History:   Diagnosis Date    Congestive heart failure     Diabetes mellitus, type 2     Essential hypertension             Past Surgical History:   Procedure Laterality Date    HX BACK SURGERY      HX COLONOSCOPY      HX ELBOW SURGERY Right     Release of Tendon    HX HAND SURGERY             Social History     Socioeconomic History    Marital status: Married     Spouse name: Not on file    Number of children: Not on file    Years of education: Not on file    Highest education level: Not on file   Occupational History    Not on file   Tobacco Use    Smoking status: Former     Current packs/day: 0.00     Types: Cigarettes     Quit date: 2003     Years since quitting: 22.3    Smokeless tobacco: Never   Substance and Sexual Activity    Alcohol use: Never    Drug use: Never    Sexual activity: Not on file   Other Topics Concern    Not on file  Social History Narrative    Not on file     Social Determinants of Health     Financial Resource Strain: Not on file   Transportation Needs: Not on file   Social Connections: Not on file   Intimate Partner Violence: Not on file   Housing Stability: Not on file       Social History     Social History Narrative    Not on file       Social History     Substance and Sexual Activity   Drug Use Never       Family Medical History:       Problem Relation (Age of Onset)    COPD Other    Cancer Father    Diabetes Mother    Diabetes type II Other    Elevated Lipids Other    Hypertension (High Blood Pressure) Brother, Other    Stroke Other    Transient ischemic attack Other              Current Outpatient Medications   Medication Sig    allopurinoL (ZYLOPRIM) 100 mg Oral Tablet Take 1 Tablet (100 mg total) by mouth Once a day    aspirin (ECOTRIN) 81 mg Oral Tablet, Delayed Release (E.C.) Take 1 Tablet (81 mg total) by mouth    cholecalciferol, vitamin D3, 25 mcg (1,000 unit) Oral Tablet 1 Tablet (1,000 Units total)    cyanocobalamin (VITAMIN B-12) 500 mcg Oral Tablet Take 1 Tablet (500 mcg total) by mouth    dapagliflozin propanediol (FARXIGA) 10 mg Oral Tablet Take 1 Tablet (10 mg total) by mouth  Daily    DULoxetine (CYMBALTA DR) 30 mg Oral Capsule, Delayed Release(E.C.) Take 1 Capsule (30 mg total) by mouth    ferrous sulfate (FEROSUL) 325 mg (65 mg iron) Oral Tablet     folic acid (FOLVITE) 1 mg Oral Tablet Take 1 Tablet (1 mg total) by mouth Once a day    furosemide (LASIX) 20 mg Oral Tablet     hydrALAZINE (APRESOLINE) 100 mg Oral Tablet Take 1 Tablet (100 mg total) by mouth    isosorbide mononitrate (IMDUR) 30 mg Oral Tablet Sustained Release 24 hr Take 1 Tablet (30 mg total) by mouth Every morning    linaGLIPtin (TRADJENTA) 5 mg Oral Tablet Take 1 Tablet (5 mg total) by mouth Daily    olmesartan (BENICAR) 20 mg Oral Tablet 1 Tablet (20 mg total)    omega 3-dha-epa-fish oil 183.3 mg-75 mg -91.6 mg-306 mg Oral Capsule omega 3 183.3 mg-dha 75 mg-epa 91.6 mg-fish oil 306 mg capsule   Take by oral route.    rosuvastatin (CRESTOR) 10 mg Oral Tablet rosuvastatin 10 mg tablet   TAKE 1 TABLET BY MOUTH EVERY DAY AT BEDTIME    tamsulosin (FLOMAX) 0.4 mg Oral Capsule TAKE 1 CAPSULE BY MOUTH EVERY EVENING AFTER DINNER    traZODone (DESYREL) 100 mg Oral Tablet trazodone 100 mg tablet   Take 1 tablet by oral route at bedtime.       Allergies   Allergen Reactions    Indomethacin  Other Adverse Reaction (Add comment) and Swelling    Metformin          PHYSICAL EXAM:  BP 115/63 (Site: Left Arm, Patient Position: Sitting, Cuff Size: Adult Large)   Pulse 53   Temp 36.4 C (97.5 F) (Temporal)   Ht 1.88 m (6\' 2" )   Wt 126 kg (276 lb 11.2 oz)   SpO2 99%  BMI 35.53 kg/m        ECOG Status: (0) Fully active, able to carry on all predisease performance without restriction    Physical Exam  Vitals reviewed.   Constitutional:       Appearance: Normal appearance. He is normal weight.   Cardiovascular:      Rate and Rhythm: Normal rate and regular rhythm.      Pulses: Normal pulses.      Heart sounds: Normal heart sounds, S1 normal and S2 normal. No murmur heard.     No S3 or S4 sounds.   Pulmonary:      Effort: Pulmonary  effort is normal.      Breath sounds: Normal breath sounds.   Abdominal:      General: Bowel sounds are normal.      Palpations: Abdomen is soft.   Lymphadenopathy:      Head:      Right side of head: No submental, submandibular, tonsillar, preauricular, posterior auricular or occipital adenopathy.      Left side of head: No submental, submandibular, tonsillar, preauricular, posterior auricular or occipital adenopathy.      Cervical: No cervical adenopathy.      Right cervical: No superficial, deep or posterior cervical adenopathy.     Left cervical: No superficial, deep or posterior cervical adenopathy.   Skin:     General: Skin is warm and dry.      Capillary Refill: Capillary refill takes less than 2 seconds.   Neurological:      Mental Status: He is alert and oriented to person, place, and time. Mental status is at baseline.          LABS:   CBC  Diff   Lab Results   Component Value Date/Time    WBC 6.1 09/30/2023 01:48 PM    HGB 14.5 09/30/2023 01:48 PM    HCT 44.5 09/30/2023 01:48 PM    PLTCNT 128 (L) 09/30/2023 01:48 PM    RBC 5.07 09/30/2023 01:48 PM    MCV 87.7 09/30/2023 01:48 PM    MCHC 32.6 09/30/2023 01:48 PM    MCH 28.6 09/30/2023 01:48 PM    RDW 15.5 09/30/2023 01:48 PM    MPV 9.7 09/30/2023 01:48 PM    Lab Results   Component Value Date/Time    PMNS 71 09/30/2023 01:48 PM    LYMPHOCYTES 17 09/30/2023 01:48 PM    EOSINOPHIL 1 09/30/2023 01:48 PM    MONOCYTES 10 09/30/2023 01:48 PM    BASOPHILS 1 09/30/2023 01:48 PM    BASOPHILS 0.10 09/30/2023 01:48 PM    PMNABS 4.30 09/30/2023 01:48 PM    LYMPHSABS 1.00 09/30/2023 01:48 PM    EOSABS 0.10 09/30/2023 01:48 PM    MONOSABS 0.60 09/30/2023 01:48 PM            Comprehensive Metabolic Profile    Lab Results   Component Value Date    SODIUM 139 09/30/2023    POTASSIUM 4.8 09/30/2023    CHLORIDE 104 09/30/2023    CO2 25 09/30/2023    ANIONGAP 10 09/30/2023    BUN 29 (H) 09/30/2023    CREATININE 2.00 (H) 09/30/2023    ALBUMIN 4.4 05/10/2023    CALCIUM 9.5  09/30/2023    GLUCOSENF 101 09/30/2023    ALKPHOS 62 05/10/2023    ALT 15 05/10/2023    AST 21 05/10/2023    TOTBILIRUBIN 0.5 05/10/2023    TOTALPROTEIN 7.4 05/10/2023  ASSESSMENT:    ICD-10-CM    1. History of ITP  Z86.2       2. 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 All relative external and internal medical records were reviewed including available H&Ps, progress notes, procedure notes, imaging's, laboratories, and pathology.   2. Thrombocytopenia:  Patient is stable and asymptomatic at this time.   I will see him in 1 year, sooner if needed.       Jared Mcintyre was given the chance to ask questions, and these were answered to their satisfaction. The patient is welcome to call with any questions or concerns in the meantime.     Return in about 1 year (around 01/07/2025).     Marvene Staff APRN, FNP-BC, AOCNP, 01/08/2024 , 11:06     You can see your note(s) in [facility]. It is common for you to encounter certain medical terminology which may be unfamiliar to you. You might see results before your provider does so please give at least 2 business days for review. Please have this understanding, that NOT all abnormal results are significant. Our office will contact you for any urgent or emergent action if necessary. If you have any questions or concerns, feel free to send a MyChart message or call the office. Please call with any new or concerning symptoms.     CC:  Festus Holts, MD  [address] [facility]  [place] [address]    Festus Holts, MD  [address] [facility]  [place],  [place] [address]      This note was partially generated using MModal Fluency Direct system, and there may be some incorrect words, spellings, and punctuation that were not noted in checking the note before saving.

## 2024-01-27 ENCOUNTER — Other Ambulatory Visit: Payer: Self-pay

## 2024-01-28 ENCOUNTER — Ambulatory Visit (INDEPENDENT_AMBULATORY_CARE_PROVIDER_SITE_OTHER): Payer: MEDICARE

## 2024-01-28 ENCOUNTER — Other Ambulatory Visit: Payer: MEDICARE | Attending: Nephrology | Admitting: Nephrology

## 2024-01-28 ENCOUNTER — Other Ambulatory Visit: Payer: Self-pay

## 2024-01-28 DIAGNOSIS — E559 Vitamin D deficiency, unspecified: Secondary | ICD-10-CM

## 2024-01-28 DIAGNOSIS — N1832 Chronic kidney disease, stage 3b: Secondary | ICD-10-CM

## 2024-01-28 LAB — CBC WITH DIFF
BASOPHIL #: 0 10*3/uL (ref 0.00–0.10)
BASOPHIL %: 1 % (ref 0–1)
EOSINOPHIL #: 0.1 10*3/uL (ref 0.00–0.50)
EOSINOPHIL %: 2 % (ref 1–8)
HCT: 40.8 % (ref 36.7–47.1)
HGB: 13.9 g/dL (ref 12.5–16.3)
LYMPHOCYTE #: 1.2 10*3/uL (ref 1.00–3.20)
LYMPHOCYTE %: 28 % (ref 15–43)
MCH: 30.1 pg (ref 23.8–33.4)
MCHC: 34.1 g/dL (ref 32.5–36.3)
MCV: 88.3 fL (ref 73.0–96.2)
MONOCYTE #: 0.3 10*3/uL (ref 0.30–1.10)
MONOCYTE %: 7 % (ref 6–14)
MPV: 10 fL (ref 7.4–11.4)
NEUTROPHIL #: 2.6 10*3/uL (ref 1.70–7.60)
NEUTROPHIL %: 63 % (ref 44–74)
PLATELETS: 123 10*3/uL — ABNORMAL LOW (ref 140–440)
RBC: 4.62 10*6/uL (ref 4.06–5.63)
RDW: 14.8 % (ref 12.1–16.2)
WBC: 4.2 10*3/uL (ref 3.6–10.2)

## 2024-01-28 LAB — BASIC METABOLIC PANEL
ANION GAP: 6 mmol/L (ref 4–13)
BUN/CREA RATIO: 16 (ref 6–22)
BUN: 31 mg/dL — ABNORMAL HIGH (ref 7–25)
CALCIUM: 9.6 mg/dL (ref 8.6–10.3)
CHLORIDE: 104 mmol/L (ref 98–107)
CO2 TOTAL: 27 mmol/L (ref 21–31)
CREATININE: 1.96 mg/dL — ABNORMAL HIGH (ref 0.60–1.30)
ESTIMATED GFR: 35 mL/min/{1.73_m2} — ABNORMAL LOW (ref >59)
GLUCOSE: 108 mg/dL (ref 74–109)
OSMOLALITY, CALCULATED: 281 mosm/kg (ref 270–290)
POTASSIUM: 4.8 mmol/L (ref 3.5–5.1)
SODIUM: 137 mmol/L (ref 136–145)

## 2024-01-28 LAB — PROTEIN/CREATININE RATIO, URINE, RANDOM
CREATININE RANDOM URINE: 58 mg/dL (ref 30–125)
PROTEIN RANDOM URINE: <4 mg/dL — ABNORMAL LOW (ref 50–80)

## 2024-01-28 LAB — PARATHYROID HORMONE (PTH): PTH: 75.2 pg/mL (ref 12.0–88.0)

## 2024-01-28 LAB — MICROALBUMIN/CREATININE RATIO, URINE, RANDOM
CREATININE RANDOM URINE: 58 mg/dL (ref 30–125)
MICROALBUMIN RANDOM URINE: <0.7 mg/dL

## 2024-01-28 LAB — MAGNESIUM: MAGNESIUM: 2.2 mg/dL (ref 1.9–2.7)

## 2024-01-28 LAB — VITAMIN D 25 TOTAL: VITAMIN D 25, TOTAL: 55.73 ng/mL (ref 30.00–100.00)

## 2024-01-28 LAB — URIC ACID: URIC ACID: 4.2 mg/dL (ref 2.3–7.6)

## 2024-02-04 ENCOUNTER — Other Ambulatory Visit: Payer: Self-pay

## 2024-02-04 ENCOUNTER — Encounter (INDEPENDENT_AMBULATORY_CARE_PROVIDER_SITE_OTHER): Payer: Self-pay | Admitting: Nephrology

## 2024-02-04 ENCOUNTER — Ambulatory Visit (INDEPENDENT_AMBULATORY_CARE_PROVIDER_SITE_OTHER): Payer: MEDICARE | Admitting: Nephrology

## 2024-02-04 VITALS — BP 109/63 | HR 54 | Ht 74.0 in | Wt 270.0 lb

## 2024-02-04 DIAGNOSIS — D631 Anemia in chronic kidney disease: Secondary | ICD-10-CM

## 2024-02-04 DIAGNOSIS — M109 Gout, unspecified: Secondary | ICD-10-CM

## 2024-02-04 DIAGNOSIS — N2581 Secondary hyperparathyroidism of renal origin: Secondary | ICD-10-CM

## 2024-02-04 DIAGNOSIS — E559 Vitamin D deficiency, unspecified: Secondary | ICD-10-CM

## 2024-02-04 DIAGNOSIS — N1832 Chronic kidney disease, stage 3b: Secondary | ICD-10-CM

## 2024-02-04 DIAGNOSIS — E1122 Type 2 diabetes mellitus with diabetic chronic kidney disease: Secondary | ICD-10-CM

## 2024-02-04 DIAGNOSIS — Z7984 Long term (current) use of oral hypoglycemic drugs: Secondary | ICD-10-CM

## 2024-02-04 DIAGNOSIS — I129 Hypertensive chronic kidney disease with stage 1 through stage 4 chronic kidney disease, or unspecified chronic kidney disease: Secondary | ICD-10-CM

## 2024-02-04 MED ORDER — HYDRALAZINE 100 MG TABLET
100.0000 mg | ORAL_TABLET | Freq: Three times a day (TID) | ORAL | 3 refills | Status: AC
Start: 2024-02-04 — End: 2025-02-03

## 2024-02-04 NOTE — Progress Notes (Signed)
 NEPHROLOGY, [facility]  [address] [place] [address]    Progress Note    Name: Jared Mcintyre MRN:  [id number]   Date: 02/04/2024 DOB:  1951-02-23 (73 y.o.)              Nephrology Follow Up Visit          HPI: 73 y.o. gentleman with past medical history of chronic kidney disease stage IIIB, baseline creatinine 2.1 GFR 32.  Patient has underlying diabetes and hypertension.  Patient is here for follow-up.    Patient is taking Farxiga  and olmesartan  he is doing well.  His blood pressure log indicating some elevation in the blood pressure discussed increasing hydralazine 100 mg 3 times a day and I told him to hold it if he has started having dizziness   Patient was instructed to be more active.  Patient was instructed to continue Flomax .        ROS:         Systematic review of 12 organ systems was negative except what mentioned in in the HPI.      OBJECTIVE:   BP 109/63   Pulse 54   Ht 1.88 m (6\' 2" )   Wt 122 kg (270 lb)   BMI 34.67 kg/m       General:  NAD, AAOx3  HEENT:  EOMI, MMM, no pallor, no icterus  NECK: No increased JVD.    HEART: Normal S1 and S2. Regular rhythm. No murmurs or rubs.   LUNGS: Clear to auscultation bilateral. No wheezes, rales, or rhonchi.   ABDOMEN: +BS, Soft, nontender and nondistended. No rebound or guarding present.   EXTREMITIES: No edema. No asterixis    NEURO : moving all extremities. emoi    LABORATORY DATA:   Lab Results   Component Value Date    BUN 31 (H) 01/28/2024    BUN 29 (H) 09/30/2023    BUN 35 (H) 05/29/2023    CREATININE 1.96 (H) 01/28/2024    CREATININE 2.00 (H) 09/30/2023    CREATININE 2.20 (H) 05/29/2023    BUNCRRATIO 16 01/28/2024    BUNCRRATIO 15 09/30/2023    BUNCRRATIO 16 05/29/2023    GFR 35 (L) 01/28/2024    GFR 35 (L) 09/30/2023    GFR 31 (L) 05/29/2023    SODIUM 137 01/28/2024    SODIUM 139 09/30/2023    SODIUM 141 05/29/2023    POTASSIUM 4.8 01/28/2024    POTASSIUM 4.8 09/30/2023    POTASSIUM 4.8 05/29/2023    CHLORIDE 104 01/28/2024     CHLORIDE 104 09/30/2023    CHLORIDE 108 (H) 05/29/2023    CO2 27 01/28/2024    CO2 25 09/30/2023    CO2 27 05/29/2023    ANIONGAP 6 01/28/2024    ANIONGAP 10 09/30/2023    ANIONGAP 6 05/29/2023    CALCIUM 9.6 01/28/2024    CALCIUM 9.5 09/30/2023    CALCIUM 9.4 05/29/2023    PHOSPHORUS 3.6 (L) 11/28/2022    PHOSPHORUS 3.7 10/11/2022    PHOSPHORUS 3.0 (L) 09/10/2022    ALBUMIN 4.4 05/10/2023    ALBUMIN 4.2 11/28/2022    ALBUMIN 4.1 10/11/2022    HGB 13.9 01/28/2024    HGB 14.5 09/30/2023    HGB 13.4 05/29/2023    HCT 40.8 01/28/2024    HCT 44.5 09/30/2023    HCT 40.6 05/29/2023    INTACTPTH 75.2 01/28/2024    INTACTPTH 66.4 09/30/2023    INTACTPTH 95.2 (H) 05/29/2023  IRON  58 11/28/2022    IRON  60 10/11/2022    IRON  35 (L) 09/10/2022    IRONBINDCAP 319 11/28/2022    IRONBINDCAP 290 10/11/2022    IRONBINDCAP 301 09/10/2022    IRONSAT 18 (L) 11/28/2022    IRONSAT 21 10/11/2022    IRONSAT 12 (L) 09/10/2022    FERRITIN 69 11/28/2022    FERRITIN 95 04/10/2022       Lab Results   Component Value Date    HA1C 6.5 (H) 10/11/2022    HA1C 6.5 (H) 09/10/2022    HA1C 6.8 (H) 04/10/2022    URICACID 4.2 01/28/2024    URICACID 4.5 09/30/2023    URICACID 5.4 05/29/2023          MEDICATIONS:  No outpatient medications have been marked as taking for the 02/04/24 encounter (Office Visit) with Leary Provencal, MD.     Current Outpatient Medications   Medication Instructions    allopurinoL  (ZYLOPRIM ) 100 mg, Oral, Daily    aspirin (ECOTRIN) 81 mg    cholecalciferol (vitamin D3) 1,000 Units    cyanocobalamin (VITAMIN B-12) 500 mcg    dapagliflozin  propanediol (FARXIGA ) 10 mg, Daily    DULoxetine (CYMBALTA DR) 30 mg    ferrous sulfate  (FEROSUL) 325 mg (65 mg iron ) Oral Tablet     folic acid  (FOLVITE ) 1 mg, Oral, Daily    furosemide (LASIX) 20 mg Oral Tablet     hydrALAZINE (APRESOLINE) 100 mg    isosorbide mononitrate (IMDUR) 30 mg, EVERY MORNING    linaGLIPtin (TRADJENTA) 5 mg, Daily    olmesartan  (BENICAR ) 20 mg    omega 3-dha-epa-fish oil  183.3 mg-75 mg -91.6 mg-306 mg Oral Capsule omega 3 183.3 mg-dha 75 mg-epa 91.6 mg-fish oil 306 mg capsule   Take by oral route.    rosuvastatin (CRESTOR) 10 mg Oral Tablet rosuvastatin 10 mg tablet   TAKE 1 TABLET BY MOUTH EVERY DAY AT BEDTIME    tamsulosin  (FLOMAX ) 0.4 mg, Oral, EVERY EVENING AFTER DINNER    traZODone (DESYREL) 100 mg Oral Tablet trazodone 100 mg tablet   Take 1 tablet by oral route at bedtime.         ASSESSMENT / PLAN:   ENCOUNTER DIAGNOSES     ICD-10-CM   1. Stage 3b chronic kidney disease (CMS HCC)  N18.32   2. Vitamin D  deficiency  E55.9             Chronic kidney disease  -Stage IIIb  -Due to diabetes and hypertension  -Creatinine is stable 2.2, 1.96/GFR 35  Lab Results   Component Value Date    CREATININE 1.96 (H) 01/28/2024    CREATININE 2.00 (H) 09/30/2023        -Baseline creatinine 2.1/GFR 32  -Total protein to creatinine ratio-  Lab Results   Component Value Date    UPCR  01/28/2024      Comment:      Calculation not performed.     UPCR 0.088 09/30/2023      -UACR not significant  -CBC and a basic metabolic panel  -Return to clinic in 4 months  -Continue low-sodium diet  -balanced Fluid intake 40-50 oz a day.    -Avoid NSAIDs.  Tylenol only for pain  -ACEI or ARBS:  yes, continue olmesartan   -Sodium Glucose Cotransporter-2 (SGLT2) Inhibitors:  Continue Farxiga  10    CKD bone mineral disease  Lab Results   Component Value Date    INTACTPTH 75.2 01/28/2024    VITAMIND25 55.73 01/28/2024  PHOSPHORUS 3.6 (L) 11/28/2022        Secondary hyperparathyroidism .  PTH is at goal     Hypertension  -Blood pressure is elevated, BP log was reviewed patient has more trouble reading is around 150s.  -Goal less than 130/80  -Continue olmesartan  and Farxiga   -increase hydralazine to 100 mg t.i.d. patient was instructed if he has started developing dizziness to stay twice a week if he is not dizzy to take it 3 times a day.  -Low-salt diet    Diabetes type 2  -A1c goal is less than 7   Lab Results    Component Value Date    HA1C 6.5 (H) 10/11/2022      -Continue current medications  -Diabetic diet  -Increase activity and exercise as possible  -Monitor A1C    Gout  -Check uric acid   Lab Results   Component Value Date    URICACID 4.2 01/28/2024       -Low uric acid diet  -continue current management.  Continue allopurinol     Anemia of CKD  - HB stable  Lab Results   Component Value Date    HGB 13.9 01/28/2024             Orders Placed This Encounter    BASIC METABOLIC PANEL    CBC/DIFF    MAGNESIUM    MICROALBUMIN/CREATININE RATIO, URINE, RANDOM    PARATHYROID  HORMONE (PTH)    PROTEIN/CREATININE RATIO, URINE, RANDOM    URIC ACID    VITAMIN D  25 TOTAL

## 2024-04-29 ENCOUNTER — Other Ambulatory Visit: Payer: MEDICARE | Attending: Nephrology | Admitting: Nephrology

## 2024-04-29 ENCOUNTER — Other Ambulatory Visit: Payer: Self-pay

## 2024-04-29 ENCOUNTER — Ambulatory Visit (INDEPENDENT_AMBULATORY_CARE_PROVIDER_SITE_OTHER): Payer: MEDICARE

## 2024-04-29 DIAGNOSIS — N1832 Chronic kidney disease, stage 3b: Secondary | ICD-10-CM | POA: Insufficient documentation

## 2024-04-29 DIAGNOSIS — E559 Vitamin D deficiency, unspecified: Secondary | ICD-10-CM | POA: Insufficient documentation

## 2024-04-29 LAB — CBC WITH DIFF
BASOPHIL #: 0 x10ˆ3/uL (ref 0.00–0.10)
BASOPHIL %: 1 % (ref 0–1)
EOSINOPHIL #: 0.1 x10ˆ3/uL (ref 0.00–0.60)
EOSINOPHIL %: 1 % (ref 1–8)
HCT: 38.6 % (ref 36.7–47.1)
HGB: 12.9 g/dL (ref 12.5–16.3)
LYMPHOCYTE #: 1 x10ˆ3/uL (ref 1.00–3.00)
LYMPHOCYTE %: 24 % (ref 15–43)
MCH: 29.7 pg (ref 23.8–33.4)
MCHC: 33.4 g/dL (ref 32.5–36.3)
MCV: 88.8 fL (ref 73.0–96.2)
MONOCYTE #: 0.4 x10ˆ3/uL (ref 0.30–1.10)
MONOCYTE %: 9 % (ref 6–14)
MPV: 9.9 fL (ref 7.4–11.4)
NEUTROPHIL #: 2.7 x10ˆ3/uL (ref 1.85–7.84)
NEUTROPHIL %: 65 % (ref 44–74)
PLATELETS: 113 x10ˆ3/uL — ABNORMAL LOW (ref 140–440)
RBC: 4.34 x10ˆ6/uL (ref 4.06–5.63)
RDW: 15.3 % (ref 12.1–16.2)
WBC: 4.1 x10ˆ3/uL (ref 3.6–10.2)

## 2024-04-29 LAB — BASIC METABOLIC PANEL
ANION GAP: 6 mmol/L (ref 4–13)
BUN/CREA RATIO: 14 (ref 6–22)
BUN: 31 mg/dL — ABNORMAL HIGH (ref 7–25)
CALCIUM: 9.4 mg/dL (ref 8.6–10.3)
CHLORIDE: 106 mmol/L (ref 98–107)
CO2 TOTAL: 25 mmol/L (ref 21–31)
CREATININE: 2.19 mg/dL — ABNORMAL HIGH (ref 0.60–1.30)
ESTIMATED GFR: 31 mL/min/1.73mˆ2 — ABNORMAL LOW (ref >59)
GLUCOSE: 106 mg/dL (ref 74–109)
OSMOLALITY, CALCULATED: 281 mosm/kg (ref 270–290)
POTASSIUM: 5.4 mmol/L — ABNORMAL HIGH (ref 3.5–5.1)
SODIUM: 137 mmol/L (ref 136–145)

## 2024-04-29 LAB — VITAMIN D 25 TOTAL: VITAMIN D 25, TOTAL: 55.1 ng/mL (ref 30.00–100.00)

## 2024-04-29 LAB — PROTEIN/CREATININE RATIO, URINE, RANDOM
CREATININE RANDOM URINE: 49 mg/dL (ref 30–125)
PROTEIN RANDOM URINE: 4 mg/dL — ABNORMAL LOW (ref 50–80)
PROTEIN/CREATININE RATIO RANDOM URINE: 0.082 mg/mg (ref 0.000–200.000)

## 2024-04-29 LAB — MICROALBUMIN/CREATININE RATIO, URINE, RANDOM
CREATININE RANDOM URINE: 49 mg/dL (ref 30–125)
MICROALBUMIN RANDOM URINE: <0.7 mg/dL

## 2024-04-29 LAB — MAGNESIUM: MAGNESIUM: 2 mg/dL (ref 1.9–2.7)

## 2024-04-29 LAB — PARATHYROID HORMONE (PTH): PTH: 49.1 pg/mL (ref 12.0–88.0)

## 2024-04-29 LAB — URIC ACID: URIC ACID: 3.9 mg/dL (ref 2.3–7.6)

## 2024-05-05 ENCOUNTER — Other Ambulatory Visit: Payer: Self-pay

## 2024-05-06 ENCOUNTER — Ambulatory Visit (INDEPENDENT_AMBULATORY_CARE_PROVIDER_SITE_OTHER): Payer: MEDICARE | Admitting: Nephrology

## 2024-05-06 DIAGNOSIS — E1122 Type 2 diabetes mellitus with diabetic chronic kidney disease: Secondary | ICD-10-CM

## 2024-05-06 DIAGNOSIS — N2581 Secondary hyperparathyroidism of renal origin: Secondary | ICD-10-CM

## 2024-05-06 DIAGNOSIS — N1832 Chronic kidney disease, stage 3b: Secondary | ICD-10-CM

## 2024-05-06 DIAGNOSIS — N401 Enlarged prostate with lower urinary tract symptoms: Secondary | ICD-10-CM

## 2024-05-06 DIAGNOSIS — E559 Vitamin D deficiency, unspecified: Secondary | ICD-10-CM

## 2024-05-06 DIAGNOSIS — Z7984 Long term (current) use of oral hypoglycemic drugs: Secondary | ICD-10-CM

## 2024-05-06 DIAGNOSIS — D631 Anemia in chronic kidney disease: Secondary | ICD-10-CM

## 2024-05-06 DIAGNOSIS — I129 Hypertensive chronic kidney disease with stage 1 through stage 4 chronic kidney disease, or unspecified chronic kidney disease: Secondary | ICD-10-CM

## 2024-05-06 DIAGNOSIS — M109 Gout, unspecified: Secondary | ICD-10-CM

## 2024-05-06 MED ORDER — CLONIDINE HCL 0.1 MG TABLET
0.1000 mg | ORAL_TABLET | Freq: Two times a day (BID) | ORAL | Status: AC | PRN
Start: 2024-05-06 — End: ?

## 2024-05-06 NOTE — Progress Notes (Signed)
 NEPHROLOGY, [facility]  [address]  [place] [address]    Progress Note    Name: Jared Mcintyre MRN:  [id number]   Date: 05/06/2024 DOB:  1951/06/30 (74 y.o.)              Nephrology Follow Up Visit          HPI: 73 y.o. gentleman with past medical history of chronic kidney disease stage IIIB, baseline creatinine 2.1 GFR 32.  Patient has underlying diabetes and hypertension.  Patient is here for follow-up.    Patient is taking Farxiga  and olmesartan  he is doing well.  Blood pressure is better at home.  Patient is trying to eat healthy and exercise.  Remains on Flomax     Patient was instructed to be more active.  Patient was instructed to continue Flomax .  Discussed with them adding Ozempic      ROS:         Systematic review of 12 organ systems was negative except what mentioned in in the HPI.      OBJECTIVE:   There were no vitals taken for this visit.  Pressure is 123/64 pulse 53, oxygen saturation 98% on room air, weight 258.6    General:  NAD, AAOx3  HEENT:  EOMI, MMM, no pallor, no icterus  NECK: No increased JVD.    HEART: Normal S1 and S2. Regular rhythm. No murmurs or rubs.   LUNGS: Clear to auscultation bilateral. No wheezes, rales, or rhonchi.   ABDOMEN: +BS, Soft, nontender and nondistended. No rebound or guarding present.   EXTREMITIES: No edema. No asterixis    NEURO : moving all extremities. emoi    LABORATORY DATA:   Lab Results   Component Value Date    BUN 31 (H) 04/29/2024    BUN 31 (H) 01/28/2024    BUN 29 (H) 09/30/2023    CREATININE 2.19 (H) 04/29/2024    CREATININE 1.96 (H) 01/28/2024    CREATININE 2.00 (H) 09/30/2023    BUNCRRATIO 14 04/29/2024    BUNCRRATIO 16 01/28/2024    BUNCRRATIO 15 09/30/2023    GFR 31 (L) 04/29/2024    GFR 35 (L) 01/28/2024    GFR 35 (L) 09/30/2023    SODIUM 137 04/29/2024    SODIUM 137 01/28/2024    SODIUM 139 09/30/2023    POTASSIUM 5.4 (H) 04/29/2024    POTASSIUM 4.8 01/28/2024    POTASSIUM 4.8 09/30/2023    CHLORIDE 106 04/29/2024    CHLORIDE  104 01/28/2024    CHLORIDE 104 09/30/2023    CO2 25 04/29/2024    CO2 27 01/28/2024    CO2 25 09/30/2023    ANIONGAP 6 04/29/2024    ANIONGAP 6 01/28/2024    ANIONGAP 10 09/30/2023    CALCIUM 9.4 04/29/2024    CALCIUM 9.6 01/28/2024    CALCIUM 9.5 09/30/2023    PHOSPHORUS 3.6 (L) 11/28/2022    PHOSPHORUS 3.7 10/11/2022    PHOSPHORUS 3.0 (L) 09/10/2022    ALBUMIN 4.4 05/10/2023    ALBUMIN 4.2 11/28/2022    ALBUMIN 4.1 10/11/2022    HGB 12.9 04/29/2024    HGB 13.9 01/28/2024    HGB 14.5 09/30/2023    HCT 38.6 04/29/2024    HCT 40.8 01/28/2024    HCT 44.5 09/30/2023    INTACTPTH 49.1 04/29/2024    INTACTPTH 75.2 01/28/2024    INTACTPTH 66.4 09/30/2023    IRON  58 11/28/2022    IRON  60 10/11/2022    IRON  35 (  L) 09/10/2022    IRONBINDCAP 319 11/28/2022    IRONBINDCAP 290 10/11/2022    IRONBINDCAP 301 09/10/2022    IRONSAT 18 (L) 11/28/2022    IRONSAT 21 10/11/2022    IRONSAT 12 (L) 09/10/2022    FERRITIN 69 11/28/2022    FERRITIN 95 04/10/2022       Lab Results   Component Value Date    HA1C 6.5 (H) 10/11/2022    HA1C 6.5 (H) 09/10/2022    HA1C 6.8 (H) 04/10/2022    URICACID 3.9 04/29/2024    URICACID 4.2 01/28/2024    URICACID 4.5 09/30/2023          MEDICATIONS:  No outpatient medications have been marked as taking for the 05/06/24 encounter (Office Visit) with Helene Meter, MD.     Current Outpatient Medications   Medication Instructions    allopurinoL  (ZYLOPRIM ) 100 mg, Oral, Daily    aspirin (ECOTRIN) 81 mg    cholecalciferol (vitamin D3) 1,000 Units    cyanocobalamin (VITAMIN B-12) 500 mcg    dapagliflozin  propanediol (FARXIGA ) 10 mg, Daily    DULoxetine (CYMBALTA DR) 30 mg    ferrous sulfate  (FEROSUL) 325 mg (65 mg iron ) Oral Tablet     folic acid  (FOLVITE ) 1 mg, Oral, Daily    furosemide (LASIX) 20 mg Oral Tablet     hydrALAZINE  (APRESOLINE ) 100 mg, Oral, 3 TIMES DAILY    isosorbide mononitrate (IMDUR) 30 mg, EVERY MORNING    linaGLIPtin (TRADJENTA) 5 mg, Daily    olmesartan  (BENICAR ) 20 mg    omega 3-dha-epa-fish  oil 183.3 mg-75 mg -91.6 mg-306 mg Oral Capsule omega 3 183.3 mg-dha 75 mg-epa 91.6 mg-fish oil 306 mg capsule   Take by oral route.    rosuvastatin (CRESTOR) 10 mg Oral Tablet rosuvastatin 10 mg tablet   TAKE 1 TABLET BY MOUTH EVERY DAY AT BEDTIME    tamsulosin  (FLOMAX ) 0.4 mg, Oral, EVERY EVENING AFTER DINNER    traZODone (DESYREL) 100 mg Oral Tablet trazodone 100 mg tablet   Take 1 tablet by oral route at bedtime.         ASSESSMENT / PLAN:   ENCOUNTER DIAGNOSES     ICD-10-CM   1. 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 Stage 3b chronic kidney disease (CMS HCC)  N18.32               Chronic kidney disease  -Stage IIIb  -Due to diabetes and hypertension  -Creatinine is stable GFR 31  Lab Results   Component Value Date    CREATININE 2.19 (H) 04/29/2024    CREATININE 1.96 (H) 01/28/2024        -Baseline creatinine 2.1/GFR 32  -Total protein to creatinine ratio-  Lab Results   Component Value Date    UPCR 0.082 04/29/2024    UPCR  01/28/2024      Comment:      Calculation not performed.       -UACR not significant  -CBC and a basic metabolic panel  -Return to clinic in 4 months  -Continue low-sodium diet  -balanced Fluid intake 40-50 oz a day.    -Avoid NSAIDs.  Tylenol only for pain  -ACEI or ARBS:  yes, continue olmesartan   -Sodium Glucose Cotransporter-2 (SGLT2) Inhibitors:  Continue Farxiga  10  -we will consider adding Ozempic needs visit discussed with the patient he wants to wait    CKD bone mineral disease  Lab Results   Component Value Date    INTACTPTH 49.1 04/29/2024  [id number] 55.10 04/29/2024    PHOSPHORUS 3.6 (L) 11/28/2022        Secondary hyperparathyroidism .  PTH is at goal     Hypertension  -Blood pressure is at goal  -Goal less than 130/80  -Continue olmesartan  and Farxiga   -Low-salt diet    Diabetes type 2  -A1c goal is less than 7   Lab Results   Component Value Date    HA1C 6.5 (H) 10/11/2022      -Continue current medications  -Diabetic diet  -Increase activity and exercise as possible  -Monitor  A1C  -may add Ozempic next visit    Gout  -Check uric acid   Lab Results   Component Value Date    URICACID 3.9 04/29/2024       -Low uric acid diet  -continue current management.  Continue allopurinol     Anemia of CKD  - HB stable  Lab Results   Component Value Date    HGB 12.9 04/29/2024         BPH  -Flomax     Orders Placed This Encounter    BASIC METABOLIC PANEL    MAGNESIUM    CBC/DIFF    MICROALBUMIN/CREATININE RATIO, URINE, RANDOM    PROTEIN/CREATININE RATIO, URINE, RANDOM    PARATHYROID  HORMONE (PTH)    URIC ACID    VITAMIN D  25 TOTAL

## 2024-06-29 ENCOUNTER — Ambulatory Visit: Payer: MEDICARE

## 2024-06-29 ENCOUNTER — Other Ambulatory Visit: Payer: Self-pay

## 2024-06-29 DIAGNOSIS — N400 Enlarged prostate without lower urinary tract symptoms: Secondary | ICD-10-CM | POA: Insufficient documentation

## 2024-06-29 DIAGNOSIS — R972 Elevated prostate specific antigen [PSA]: Secondary | ICD-10-CM | POA: Insufficient documentation

## 2024-06-29 LAB — PSA, DIAGNOSTIC: PSA: 3.61 ng/mL (ref <=4.00)

## 2024-07-01 ENCOUNTER — Other Ambulatory Visit: Payer: Self-pay

## 2024-07-02 ENCOUNTER — Ambulatory Visit (INDEPENDENT_AMBULATORY_CARE_PROVIDER_SITE_OTHER): Payer: MEDICARE

## 2024-07-02 ENCOUNTER — Encounter (INDEPENDENT_AMBULATORY_CARE_PROVIDER_SITE_OTHER): Payer: Self-pay

## 2024-07-02 VITALS — BP 112/67 | HR 60 | Wt 261.0 lb

## 2024-07-02 DIAGNOSIS — N529 Male erectile dysfunction, unspecified: Secondary | ICD-10-CM | POA: Insufficient documentation

## 2024-07-02 DIAGNOSIS — R972 Elevated prostate specific antigen [PSA]: Secondary | ICD-10-CM

## 2024-07-02 DIAGNOSIS — N400 Enlarged prostate without lower urinary tract symptoms: Secondary | ICD-10-CM

## 2024-07-02 NOTE — Progress Notes (Signed)
 UROLOGY, [facility]  [address] [place] [address]       Name: Jared Mcintyre MRN:  [id number]   Date: 07/02/2024 Age: 73 y.o.         HPI:  Jared Mcintyre is a 73 y.o. Black or [place] American male who comes in today for follow-up of BPH, erectile dysfunction and an elevated PSA.  Says the tamsulosin  continues to help him and he denies any gross hematuria, dysuria or urinary tract infection symptoms.  His most recent PSA is 3.6 which is down from over for about 6 months previous.  He has been started on a heart medications so he can no longer take oral pills for erections.    Past Medical History:   Diagnosis Date    Congestive heart failure     Diabetes mellitus, type 2     Essential hypertension       Past Surgical History:   Procedure Laterality Date    HX BACK SURGERY      HX COLONOSCOPY      HX ELBOW SURGERY Right     Release of Tendon    HX HAND SURGERY        Social History[1]    Family Medical History:       Problem Relation (Age of Onset)    COPD Other    Cancer Father    Diabetes Mother    Diabetes type II Other    Elevated Lipids Other    Hypertension (High Blood Pressure) Brother, Other    Stroke Other    Transient ischemic attack Other           Outpatient Medications Marked as Taking for the 07/02/24 encounter (Office Visit) with Sandee Burnard BRAVO, MD   Medication Sig    cloNIDine  HCL (CATAPRES ) 0.1 mg Oral Tablet Take 1 Tablet (0.1 mg total) by mouth Twice per day as needed for Hypertension (Patient taking differently: Take 1 Tablet (0.1 mg total) by mouth Daily)      Allergies[2]     Review of Systems:   As discussed in HPI        Physical Exam:   Vitals:    07/02/24 1028   BP: 112/67   Pulse: 60   Weight: 118 kg (261 lb)         Gen: NAD, alert  Pulm: unlabored at rest  CV: palpable pulses  Abd: soft, Nt/ND  GU: no suprapubic tenderness, no CVAT.  Rectal exam revealed a 30 g prostate with no nodules, induration or asymmetry.      Urinalysis (automated dipstick):    Urine Dip  Results:       Urine POCT           Post-void residual, by bladder scan:         PSA History:  PROSTATE SPECIFIC ANTIGEN   Lab Results   Component Value Date/Time    PROSSPECAG 3.61 06/29/2024 01:21 PM            Data Review:  Pertinent laboratory data and imaging studies reviewed.      PSA level      Assessment:  Assessment/Plan   1. Benign prostatic hyperplasia, unspecified whether lower urinary tract symptoms present    2. Erectile dysfunction, unspecified erectile dysfunction type    3. Elevated PSA         Plan:  Continue with the tamsulosin  and follow-up in 6 months with a PSA prior.  No orders of the defined types were placed in this encounter.    No follow-ups on file.        Burnard FORBES Malling, MD    This note may have been fully or partially generated using MModal Fluency Direct system, and there may be some incorrect words, spellings, and punctuation that were not identified in checking the note before saving.       [1]   Social History  Tobacco Use    Smoking status: Former     Current packs/day: 0.00     Types: Cigarettes     Quit date: 2003     Years since quitting: 22.7    Smokeless tobacco: Never   Substance Use Topics    Alcohol use: Never    Drug use: Never   [2]   Allergies  Allergen Reactions    Indomethacin  Other Adverse Reaction (Add comment) and Swelling    Metformin

## 2024-09-03 ENCOUNTER — Other Ambulatory Visit (INDEPENDENT_AMBULATORY_CARE_PROVIDER_SITE_OTHER): Payer: Self-pay

## 2024-09-10 ENCOUNTER — Encounter (INDEPENDENT_AMBULATORY_CARE_PROVIDER_SITE_OTHER): Payer: Self-pay | Admitting: Nephrology

## 2024-10-29 ENCOUNTER — Other Ambulatory Visit: Payer: Self-pay

## 2024-10-29 ENCOUNTER — Other Ambulatory Visit: Payer: MEDICARE | Admitting: Nephrology

## 2024-10-29 ENCOUNTER — Ambulatory Visit (INDEPENDENT_AMBULATORY_CARE_PROVIDER_SITE_OTHER): Payer: Self-pay

## 2024-10-29 DIAGNOSIS — E559 Vitamin D deficiency, unspecified: Secondary | ICD-10-CM

## 2024-10-29 DIAGNOSIS — N1832 Chronic kidney disease, stage 3b: Secondary | ICD-10-CM

## 2024-10-29 LAB — PROTEIN/CREATININE RATIO, URINE, RANDOM
CREATININE RANDOM URINE: 88 mg/dL (ref 30–125)
PROTEIN RANDOM URINE: 7 mg/dL — ABNORMAL LOW (ref 50–80)
PROTEIN/CREATININE RATIO RANDOM URINE: 0.08 mg/mg (ref 0.000–200.000)

## 2024-10-29 LAB — BASIC METABOLIC PANEL
ANION GAP: 6 mmol/L (ref 4–13)
BUN/CREA RATIO: 16 (ref 6–22)
BUN: 29 mg/dL — ABNORMAL HIGH (ref 7–25)
CALCIUM: 8.9 mg/dL (ref 8.6–10.3)
CHLORIDE: 108 mmol/L — ABNORMAL HIGH (ref 98–107)
CO2 TOTAL: 24 mmol/L (ref 21–31)
CREATININE: 1.85 mg/dL — ABNORMAL HIGH (ref 0.60–1.30)
ESTIMATED GFR: 38 mL/min/{1.73_m2} — ABNORMAL LOW (ref >59)
GLUCOSE: 135 mg/dL — ABNORMAL HIGH (ref 74–109)
OSMOLALITY, CALCULATED: 284 mosm/kg (ref 270–290)
POTASSIUM: 4.6 mmol/L (ref 3.5–5.1)
SODIUM: 138 mmol/L (ref 136–145)

## 2024-10-29 LAB — CBC WITH DIFF
BASOPHIL #: 0 10*3/uL (ref 0.00–0.10)
BASOPHIL %: 1 % (ref 0–1)
EOSINOPHIL #: 0.1 10*3/uL (ref 0.00–0.60)
EOSINOPHIL %: 2 % (ref 1–8)
HCT: 41.7 % (ref 36.7–47.1)
HGB: 14.1 g/dL (ref 12.5–16.3)
LYMPHOCYTE #: 0.9 10*3/uL — ABNORMAL LOW (ref 1.00–3.00)
LYMPHOCYTE %: 20 % (ref 15–43)
MCH: 29.2 pg (ref 23.8–33.4)
MCHC: 33.8 g/dL (ref 32.5–36.3)
MCV: 86.3 fL (ref 73.0–96.2)
MONOCYTE #: 0.4 10*3/uL (ref 0.30–1.10)
MONOCYTE %: 10 % (ref 6–14)
MPV: 9.7 fL (ref 7.4–11.4)
NEUTROPHIL #: 2.8 10*3/uL (ref 1.85–7.84)
NEUTROPHIL %: 67 % (ref 44–74)
PLATELETS: 109 10*3/uL — ABNORMAL LOW (ref 140–440)
RBC: 4.83 10*6/uL (ref 4.06–5.63)
RDW: 15.3 % (ref 12.1–16.2)
WBC: 4.2 10*3/uL (ref 3.6–10.2)

## 2024-10-29 LAB — PARATHYROID HORMONE (PTH): PTH: 49.2 pg/mL (ref 12.0–88.0)

## 2024-10-29 LAB — VITAMIN D 25 TOTAL: VITAMIN D 25, TOTAL: 49.73 ng/mL (ref 30.00–100.00)

## 2024-10-29 LAB — MICROALBUMIN/CREATININE RATIO, URINE, RANDOM
CREATININE RANDOM URINE: 88 mg/dL (ref 30–125)
MICROALBUMIN RANDOM URINE: 0.8 mg/dL
MICROALBUMIN/CREATININE RATIO RANDOM URINE: 9.1 mg/g

## 2024-10-29 LAB — MAGNESIUM: MAGNESIUM: 2 mg/dL (ref 1.9–2.7)

## 2024-10-29 LAB — URIC ACID: URIC ACID: 4.3 mg/dL (ref 2.3–7.6)

## 2024-11-05 ENCOUNTER — Encounter (INDEPENDENT_AMBULATORY_CARE_PROVIDER_SITE_OTHER): Payer: MEDICARE | Admitting: Nephrology

## 2024-12-31 ENCOUNTER — Encounter (INDEPENDENT_AMBULATORY_CARE_PROVIDER_SITE_OTHER): Payer: Self-pay

## 2025-01-13 ENCOUNTER — Ambulatory Visit (INDEPENDENT_AMBULATORY_CARE_PROVIDER_SITE_OTHER): Payer: Self-pay | Admitting: NURSE PRACTITIONER
